# Patient Record
Sex: Female | Born: 1945 | Race: White | Hispanic: No | Marital: Married | Smoking: Former smoker
Health system: Southern US, Community
[De-identification: ages and names within clinical notes are randomized; demographics above are authoritative.]

## PROBLEM LIST (undated history)

## (undated) DIAGNOSIS — T4145XA Adverse effect of unspecified anesthetic, initial encounter: Secondary | ICD-10-CM

## (undated) DIAGNOSIS — G709 Myoneural disorder, unspecified: Secondary | ICD-10-CM

## (undated) DIAGNOSIS — M1990 Unspecified osteoarthritis, unspecified site: Secondary | ICD-10-CM

## (undated) DIAGNOSIS — T8859XA Other complications of anesthesia, initial encounter: Secondary | ICD-10-CM

## (undated) DIAGNOSIS — M8580 Other specified disorders of bone density and structure, unspecified site: Secondary | ICD-10-CM

## (undated) DIAGNOSIS — K219 Gastro-esophageal reflux disease without esophagitis: Secondary | ICD-10-CM

## (undated) DIAGNOSIS — I10 Essential (primary) hypertension: Secondary | ICD-10-CM

## (undated) DIAGNOSIS — N390 Urinary tract infection, site not specified: Secondary | ICD-10-CM

## (undated) DIAGNOSIS — E785 Hyperlipidemia, unspecified: Secondary | ICD-10-CM

## (undated) HISTORY — PX: BREAST SURGERY: SHX581

---

## 2015-06-21 NOTE — Patient Instructions (Addendum)
YOUR PROCEDURE IS SCHEDULED ON :  07/03/15  REPORT TO [facility] MAIN ENTRANCE FOLLOW SIGNS TO EAST ELEVATOR - GO TO 3rd FLOOR CHECK IN AT 3 EAST NURSES STATION (SHORT STAY) AT:  10:00 AM  CALL THIS NUMBER IF YOU HAVE PROBLEMS THE MORNING OF SURGERY [phone]  REMEMBER:ONLY 1 PER PERSON MAY GO TO SHORT STAY WITH YOU TO GET READY THE MORNING OF YOUR SURGERY  DO NOT EAT FOOD  AFTER MIDNIGHT  MAY HAVE CLEAR LIQUIDS UNTIL 7:00 AM  TAKE THESE MEDICINES THE MORNING OF SURGERY: GABAPENTIN / PREVACID  CLEAR LIQUID DIET  Foods Allowed                                                                     Foods Excluded  Coffee and tea, regular and decaf                             liquids that you cannot  Plain Jell-O in any flavor                                             see through such as: Fruit ices (not with fruit pulp)                                     milk, soups, orange juice  Iced Popsicles                                                          All solid food Carbonated beverages, regular and diet                                    Cranberry, grape and apple juices Sports drinks like Gatorade Lightly seasoned clear broth or consume(fat free) Sugar, honey syrup  _____________________________________________________________________    YOU MAY NOT HAVE ANY METAL ON YOUR BODY INCLUDING HAIR PINS AND PIERCING'S. DO NOT WEAR JEWELRY, MAKEUP, LOTIONS, POWDERS OR PERFUMES. DO NOT WEAR NAIL POLISH. DO NOT SHAVE 48 HRS PRIOR TO SURGERY. MEN MAY SHAVE FACE AND NECK.  DO NOT BRING VALUABLES TO HOSPITAL. [name] IS NOT RESPONSIBLE FOR VALUABLES.  CONTACTS, DENTURES OR PARTIALS MAY NOT BE WORN TO SURGERY. LEAVE SUITCASE IN CAR. CAN BE BROUGHT TO ROOM AFTER SURGERY.  PATIENTS DISCHARGED THE DAY OF SURGERY WILL NOT BE ALLOWED TO DRIVE HOME.  PLEASE READ OVER THE FOLLOWING INSTRUCTION  SHEETS _________________________________________________________________________________                                          [facility] - PREPARING FOR SURGERY  Before surgery, you can play  an important role.  Because skin is not sterile, your skin needs to be as free of germs as possible.  You can reduce the number of germs on your skin by washing with CHG (chlorahexidine gluconate) soap before surgery.  CHG is an antiseptic cleaner which kills germs and bonds with the skin to continue killing germs even after washing. Please DO NOT use if you have an allergy to CHG or antibacterial soaps.  If your skin becomes reddened/irritated stop using the CHG and inform your nurse when you arrive at Short Stay. Do not shave (including legs and underarms) for at least 48 hours prior to the first CHG shower.  You may shave your face. Please follow these instructions carefully:   1.  Shower with CHG Soap the night before surgery and the  morning of Surgery.   2.  If you choose to wash your hair, wash your hair first as usual with your  normal  Shampoo.   3.  After you shampoo, rinse your hair and body thoroughly to remove the  shampoo.                                         4.  Use CHG as you would any other liquid soap.  You can apply chg directly  to the skin and wash . Gently wash with scrungie or clean wascloth    5.  Apply the CHG Soap to your body ONLY FROM THE NECK DOWN.   Do not use on open                           Wound or open sores. Avoid contact with eyes, ears mouth and genitals (private parts).                        Genitals (private parts) with your normal soap.              6.  Wash thoroughly, paying special attention to the area where your surgery  will be performed.   7.  Thoroughly rinse your body with warm water from the neck down.   8.  DO NOT shower/wash with your normal soap after using and rinsing off  the CHG Soap .                9.  Pat yourself dry with a clean  towel.             10.  Wear clean night clothes to bed after shower             11.  Place clean sheets on your bed the night of your first shower and do not  sleep with pets.  Day of Surgery : Do not apply any lotions/deodorants the morning of surgery.  Please wear clean clothes to the hospital/surgery center.  FAILURE TO FOLLOW THESE INSTRUCTIONS MAY RESULT IN THE CANCELLATION OF YOUR SURGERY    PATIENT SIGNATURE_________________________________  ______________________________________________________________________     Amy Schmidt  An incentive spirometer is a tool that can help keep your lungs clear and active. This tool measures how well you are filling your lungs with each breath. Taking long deep breaths may help reverse or decrease the chance of developing breathing (pulmonary) problems (especially infection) following:  A long period of time when you  are unable to move or be active. BEFORE THE PROCEDURE   If the spirometer includes an indicator to show your best effort, your nurse or respiratory therapist will set it to a desired goal.  If possible, sit up straight or lean slightly forward. Try not to slouch.  Hold the incentive spirometer in an upright position. INSTRUCTIONS FOR USE   Sit on the edge of your bed if possible, or sit up as far as you can in bed or on a chair.  Hold the incentive spirometer in an upright position.  Breathe out normally.  Place the mouthpiece in your mouth and seal your lips tightly around it.  Breathe in slowly and as deeply as possible, raising the piston or the ball toward the top of the column.  Hold your breath for 3-5 seconds or for as long as possible. Allow the piston or ball to fall to the bottom of the column.  Remove the mouthpiece from your mouth and breathe out normally.  Rest for a few seconds and repeat Steps 1 through 7 at least 10 times every 1-2 hours when you are awake. Take your time and take a few  normal breaths between deep breaths.  The spirometer may include an indicator to show your best effort. Use the indicator as a goal to work toward during each repetition.  After each set of 10 deep breaths, practice coughing to be sure your lungs are clear. If you have an incision (the cut made at the time of surgery), support your incision when coughing by placing a pillow or rolled up towels firmly against it. Once you are able to get out of bed, walk around indoors and cough well. You may stop using the incentive spirometer when instructed by your caregiver.  RISKS AND COMPLICATIONS  Take your time so you do not get dizzy or light-headed.  If you are in pain, you may need to take or ask for pain medication before doing incentive spirometry. It is harder to take a deep breath if you are having pain. AFTER USE  Rest and breathe slowly and easily.  It can be helpful to keep track of a log of your progress. Your caregiver can provide you with a simple table to help with this. If you are using the spirometer at home, follow these instructions: Jessie IF:   You are having difficultly using the spirometer.  You have trouble using the spirometer as often as instructed.  Your pain medication is not giving enough relief while using the spirometer.  You develop fever of 100.5 F (38.1 C) or higher. SEEK IMMEDIATE MEDICAL CARE IF:   You cough up bloody sputum that had not been present before.  You develop fever of 102 F (38.9 C) or greater.  You develop worsening pain at or near the incision site. MAKE SURE YOU:   Understand these instructions.  Will watch your condition.  Will get help right away if you are not doing well or get worse. Document Released: 06/30/2006 Document Revised: 05/12/2011 Document Reviewed: 08/31/2006 [employer] Patient Information 2014 [employer], [place].   ________________________________________________________________________  WHAT IS A BLOOD  TRANSFUSION? Blood Transfusion Information  A transfusion is the replacement of blood or some of its parts. Blood is made up of multiple cells which provide different functions.  Red blood cells carry oxygen and are used for blood loss replacement.  White blood cells fight against infection.  Platelets control bleeding.  Plasma helps clot blood.  Other blood  products are available for specialized needs, such as hemophilia or other clotting disorders. BEFORE THE TRANSFUSION  Who gives blood for transfusions?   Healthy volunteers who are fully evaluated to make sure their blood is safe. This is blood bank blood. Transfusion therapy is the safest it has ever been in the practice of medicine. Before blood is taken from a donor, a complete history is taken to make sure that person has no history of diseases nor engages in risky social behavior (examples are intravenous drug use or sexual activity with multiple partners). The donor's travel history is screened to minimize risk of transmitting infections, such as malaria. The donated blood is tested for signs of infectious diseases, such as HIV and hepatitis. The blood is then tested to be sure it is compatible with you in order to minimize the chance of a transfusion reaction. If you or a relative donates blood, this is often done in anticipation of surgery and is not appropriate for emergency situations. It takes many days to process the donated blood. RISKS AND COMPLICATIONS Although transfusion therapy is very safe and saves many lives, the main dangers of transfusion include:   Getting an infectious disease.  Developing a transfusion reaction. This is an allergic reaction to something in the blood you were given. Every precaution is taken to prevent this. The decision to have a blood transfusion has been considered carefully by your caregiver before blood is given. Blood is not given unless the benefits outweigh the risks. AFTER THE  TRANSFUSION  Right after receiving a blood transfusion, you will usually feel much better and more energetic. This is especially true if your red blood cells have gotten low (anemic). The transfusion raises the level of the red blood cells which carry oxygen, and this usually causes an energy increase.  The nurse administering the transfusion will monitor you carefully for complications. HOME CARE INSTRUCTIONS  No special instructions are needed after a transfusion. You may find your energy is better. Speak with your caregiver about any limitations on activity for underlying diseases you may have. SEEK MEDICAL CARE IF:   Your condition is not improving after your transfusion.  You develop redness or irritation at the intravenous (IV) site. SEEK IMMEDIATE MEDICAL CARE IF:  Any of the following symptoms occur over the next 12 hours:  Shaking chills.  You have a temperature by mouth above 102 F (38.9 C), not controlled by medicine.  Chest, back, or muscle pain.  People around you feel you are not acting correctly or are confused.  Shortness of breath or difficulty breathing.  Dizziness and fainting.  You get a rash or develop hives.  You have a decrease in urine output.  Your urine turns a dark color or changes to pink, red, or brown. Any of the following symptoms occur over the next 10 days:  You have a temperature by mouth above 102 F (38.9 C), not controlled by medicine.  Shortness of breath.  Weakness after normal activity.  The white part of the eye turns yellow (jaundice).  You have a decrease in the amount of urine or are urinating less often.  Your urine turns a dark color or changes to pink, red, or brown. Document Released: 02/15/2000 Document Revised: 05/12/2011 Document Reviewed: 10/04/2007 [facility] Patient Information 2014 [place], [place].  _______________________________________________________________________

## 2015-06-21 NOTE — H&P (Signed)
TOTAL HIP ADMISSION H&P  Patient is admitted for left total hip arthroplasty, anterior approach.  Subjective:  Chief Complaint:    Left hip primary OA / pain  HPI: Amy Schmidt, 70 y.o. female, has a history of pain and functional disability in the left hip(s) due to arthritis and patient has failed non-surgical conservative treatments for greater than 12 weeks to include NSAID's and/or analgesics, corticosteriod injections and activity modification.  Onset of symptoms was gradual starting 5+ years ago with gradually worsening course since that time.The patient noted no past surgery on the right hip(s).  Patient currently rates pain in the right hip at 7 out of 10 with activity. Patient has night pain, worsening of pain with activity and weight bearing, trendelenberg gait, pain that interfers with activities of daily living and pain with passive range of motion. Patient has evidence of periarticular osteophytes and joint space narrowing by imaging studies. This condition presents safety issues increasing the risk of falls.  There is no current active infection.   Risks, benefits and expectations were discussed with the patient.  Risks including but not limited to the risk of anesthesia, blood clots, nerve damage, blood vessel damage, failure of the prosthesis, infection and up to and including death.  Patient understand the risks, benefits and expectations and wishes to proceed with surgery.   PCP: No primary care provider on file.  D/C Plans:      Home   Post-op Meds:       No Rx given  Tranexamic Acid:      To be given - IV   Decadron:      Is to be given  FYI:     ASA  Norco    There are no active problems to display for this patient.  No past medical history on file.  No past surgical history on file.  No prescriptions prior to admission   Allergies not on file   Social History  Substance Use Topics  . Smoking status: Not on file  . Smokeless tobacco: Not on file  . Alcohol  Use: Not on file       Review of Systems  Constitutional: Negative.   HENT: Positive for tinnitus.   Eyes: Negative.   Respiratory: Negative.   Cardiovascular: Negative.   Gastrointestinal: Positive for heartburn.  Genitourinary: Negative.   Musculoskeletal: Positive for joint pain.  Skin: Negative.   Neurological: Negative.   Endo/Heme/Allergies: Negative.   Psychiatric/Behavioral: Negative.     Objective:  Physical Exam  Constitutional: She is oriented to person, place, and time. She appears well-developed.  HENT:  Head: Normocephalic.  Eyes: Pupils are equal, round, and reactive to light.  Neck: Neck supple. No JVD present. No tracheal deviation present. No thyromegaly present.  Cardiovascular: Normal rate, regular rhythm, normal heart sounds and intact distal pulses.   Respiratory: Effort normal and breath sounds normal. No stridor. No respiratory distress. She has no wheezes.  GI: Soft. There is no tenderness. There is no guarding.  Musculoskeletal:       Left hip: She exhibits decreased range of motion, decreased strength, tenderness and bony tenderness. She exhibits no swelling, no deformity and no laceration.  Lymphadenopathy:    She has no cervical adenopathy.  Neurological: She is alert and oriented to person, place, and time.  Skin: Skin is warm and dry.  Psychiatric: She has a normal mood and affect.      Imaging Review Plain radiographs demonstrate severe degenerative joint disease of the  left hip(s). The bone quality appears to be good for age and reported activity level.  Assessment/Plan:  End stage arthritis, left hip(s)  The patient history, physical examination, clinical judgement of the provider and imaging studies are consistent with end stage degenerative joint disease of the left hip(s) and total hip arthroplasty is deemed medically necessary. The treatment options including medical management, injection therapy, arthroscopy and arthroplasty were  discussed at length. The risks and benefits of total hip arthroplasty were presented and reviewed. The risks due to aseptic loosening, infection, stiffness, dislocation/subluxation,  thromboembolic complications and other imponderables were discussed.  The patient acknowledged the explanation, agreed to proceed with the plan and consent was signed. Patient is being admitted for inpatient treatment for surgery, pain control, PT, OT, prophylactic antibiotics, VTE prophylaxis, progressive ambulation and ADL's and discharge planning.The patient is planning to be discharged home with home health services.      Anastasio AuerbachMatthew S. Claryssa Sandner   PA-C  06/21/2015, 12:39 PM

## 2015-06-22 ENCOUNTER — Encounter (HOSPITAL_COMMUNITY): Payer: Self-pay

## 2015-06-22 ENCOUNTER — Encounter (HOSPITAL_COMMUNITY)
Admission: RE | Admit: 2015-06-22 | Discharge: 2015-06-22 | Disposition: A | Discharge status: Home / Self Care | Payer: Medicare Other | Source: Ambulatory Visit | Attending: Orthopedic Surgery | Admitting: Orthopedic Surgery

## 2015-06-22 DIAGNOSIS — Z01812 Encounter for preprocedural laboratory examination: Secondary | ICD-10-CM | POA: Diagnosis not present

## 2015-06-22 DIAGNOSIS — M1612 Unilateral primary osteoarthritis, left hip: Secondary | ICD-10-CM | POA: Diagnosis not present

## 2015-06-22 DIAGNOSIS — Z01818 Encounter for other preprocedural examination: Secondary | ICD-10-CM | POA: Insufficient documentation

## 2015-06-22 DIAGNOSIS — Z0183 Encounter for blood typing: Secondary | ICD-10-CM | POA: Diagnosis not present

## 2015-06-22 DIAGNOSIS — I10 Essential (primary) hypertension: Secondary | ICD-10-CM | POA: Insufficient documentation

## 2015-06-22 HISTORY — DX: Adverse effect of unspecified anesthetic, initial encounter: T41.45XA

## 2015-06-22 HISTORY — DX: Gastro-esophageal reflux disease without esophagitis: K21.9

## 2015-06-22 HISTORY — DX: Hyperlipidemia, unspecified: E78.5

## 2015-06-22 HISTORY — DX: Urinary tract infection, site not specified: N39.0

## 2015-06-22 HISTORY — DX: Other specified disorders of bone density and structure, unspecified site: M85.80

## 2015-06-22 HISTORY — DX: Unspecified osteoarthritis, unspecified site: M19.90

## 2015-06-22 HISTORY — DX: Essential (primary) hypertension: I10

## 2015-06-22 HISTORY — DX: Other complications of anesthesia, initial encounter: T88.59XA

## 2015-06-22 LAB — TYPE AND SCREEN
ABO/RH(D): O POS
ANTIBODY SCREEN: NEGATIVE

## 2015-06-22 LAB — CBC
HCT: 41.9 % (ref 36.0–46.0)
Hemoglobin: 14.1 g/dL (ref 12.0–15.0)
MCH: 29.7 pg (ref 26.0–34.0)
MCHC: 33.7 g/dL (ref 30.0–36.0)
MCV: 88.4 fL (ref 78.0–100.0)
PLATELETS: 344 10*3/uL (ref 150–400)
RBC: 4.74 MIL/uL (ref 3.87–5.11)
RDW: 12.9 % (ref 11.5–15.5)
WBC: 8.7 10*3/uL (ref 4.0–10.5)

## 2015-06-22 LAB — BASIC METABOLIC PANEL
Anion gap: 10 (ref 5–15)
BUN: 20 mg/dL (ref 6–20)
CHLORIDE: 100 mmol/L — AB (ref 101–111)
CO2: 27 mmol/L (ref 22–32)
CREATININE: 0.82 mg/dL (ref 0.44–1.00)
Calcium: 9.9 mg/dL (ref 8.9–10.3)
GFR calc non Af Amer: >60 mL/min (ref >60)
Glucose, Bld: 78 mg/dL (ref 65–99)
Potassium: 4.2 mmol/L (ref 3.5–5.1)
Sodium: 137 mmol/L (ref 135–145)

## 2015-06-22 LAB — SURGICAL PCR SCREEN
MRSA, PCR: NEGATIVE
STAPHYLOCOCCUS AUREUS: NEGATIVE

## 2015-06-22 LAB — PROTIME-INR
INR: 1.02 (ref 0.00–1.49)
Prothrombin Time: 13.2 seconds (ref 11.6–15.2)

## 2015-06-22 LAB — ABO/RH: ABO/RH(D): O POS

## 2015-07-03 ENCOUNTER — Inpatient Hospital Stay (HOSPITAL_COMMUNITY)
Admission: RE | Admit: 2015-07-03 | Discharge: 2015-07-04 | DRG: 470 | Disposition: A | Discharge status: Home Health | Payer: Medicare Other | Source: Ambulatory Visit | Attending: Orthopedic Surgery | Admitting: Orthopedic Surgery

## 2015-07-03 ENCOUNTER — Encounter (HOSPITAL_COMMUNITY)
Admission: RE | Disposition: A | Discharge status: Home Health | Payer: Self-pay | Source: Ambulatory Visit | Attending: Orthopedic Surgery

## 2015-07-03 ENCOUNTER — Encounter (HOSPITAL_COMMUNITY): Payer: Self-pay

## 2015-07-03 ENCOUNTER — Inpatient Hospital Stay (HOSPITAL_COMMUNITY): Payer: Medicare Other | Admitting: Anesthesiology

## 2015-07-03 ENCOUNTER — Inpatient Hospital Stay (HOSPITAL_COMMUNITY): Payer: Medicare Other

## 2015-07-03 DIAGNOSIS — I10 Essential (primary) hypertension: Secondary | ICD-10-CM | POA: Diagnosis present

## 2015-07-03 DIAGNOSIS — Z96649 Presence of unspecified artificial hip joint: Secondary | ICD-10-CM

## 2015-07-03 DIAGNOSIS — H9319 Tinnitus, unspecified ear: Secondary | ICD-10-CM | POA: Diagnosis present

## 2015-07-03 DIAGNOSIS — E663 Overweight: Secondary | ICD-10-CM | POA: Diagnosis present

## 2015-07-03 DIAGNOSIS — Z6828 Body mass index (BMI) 28.0-28.9, adult: Secondary | ICD-10-CM

## 2015-07-03 DIAGNOSIS — Z79899 Other long term (current) drug therapy: Secondary | ICD-10-CM

## 2015-07-03 DIAGNOSIS — M1612 Unilateral primary osteoarthritis, left hip: Principal | ICD-10-CM | POA: Diagnosis present

## 2015-07-03 DIAGNOSIS — M25552 Pain in left hip: Secondary | ICD-10-CM | POA: Diagnosis present

## 2015-07-03 HISTORY — PX: TOTAL HIP ARTHROPLASTY: SHX124

## 2015-07-03 SURGERY — ARTHROPLASTY, HIP, TOTAL, ANTERIOR APPROACH
Anesthesia: Spinal | Site: Hip | Laterality: Left

## 2015-07-03 MED ORDER — CEFAZOLIN SODIUM-DEXTROSE 2-4 GM/100ML-% IV SOLN
2.0000 g | Freq: Four times a day (QID) | INTRAVENOUS | Status: AC
  Administered 2015-07-03 – 2015-07-04 (×2): 2 g via INTRAVENOUS
  Filled 2015-07-03 (×2): qty 100

## 2015-07-03 MED ORDER — SODIUM CHLORIDE 0.9 % IV SOLN
1000.0000 mg | Freq: Once | INTRAVENOUS | Status: AC
  Administered 2015-07-03: 1000 mg via INTRAVENOUS
  Filled 2015-07-03: qty 10

## 2015-07-03 MED ORDER — ALUM & MAG HYDROXIDE-SIMETH 200-200-20 MG/5ML PO SUSP: 30.0000 mL | ORAL | Status: DC | PRN

## 2015-07-03 MED ORDER — LACTATED RINGERS IV SOLN: INTRAVENOUS | Status: DC

## 2015-07-03 MED ORDER — ASPIRIN EC 325 MG PO TBEC
325.0000 mg | DELAYED_RELEASE_TABLET | Freq: Two times a day (BID) | ORAL | Status: DC
  Administered 2015-07-04: 325 mg via ORAL
  Filled 2015-07-03 (×3): qty 1

## 2015-07-03 MED ORDER — MIDAZOLAM HCL 2 MG/2ML IJ SOLN
INTRAMUSCULAR | Status: DC | PRN
  Administered 2015-07-03: 2 mg via INTRAVENOUS

## 2015-07-03 MED ORDER — CELECOXIB 200 MG PO CAPS
200.0000 mg | ORAL_CAPSULE | Freq: Two times a day (BID) | ORAL | Status: DC
  Administered 2015-07-03 – 2015-07-04 (×2): 200 mg via ORAL
  Filled 2015-07-03 (×3): qty 1

## 2015-07-03 MED ORDER — PANTOPRAZOLE SODIUM 40 MG PO TBEC
40.0000 mg | DELAYED_RELEASE_TABLET | Freq: Every day | ORAL | Status: DC
  Administered 2015-07-04: 40 mg via ORAL
  Filled 2015-07-03: qty 1

## 2015-07-03 MED ORDER — BISACODYL 10 MG RE SUPP: 10.0000 mg | Freq: Every day | RECTAL | Status: DC | PRN

## 2015-07-03 MED ORDER — LACTATED RINGERS IV SOLN
INTRAVENOUS | Status: DC
  Administered 2015-07-03 (×2): via INTRAVENOUS

## 2015-07-03 MED ORDER — MENTHOL 3 MG MT LOZG: 1.0000 | LOZENGE | OROMUCOSAL | Status: DC | PRN

## 2015-07-03 MED ORDER — PROPOFOL 10 MG/ML IV BOLUS
INTRAVENOUS | Status: AC
  Filled 2015-07-03: qty 60

## 2015-07-03 MED ORDER — PHENYLEPHRINE 40 MCG/ML (10ML) SYRINGE FOR IV PUSH (FOR BLOOD PRESSURE SUPPORT)
PREFILLED_SYRINGE | INTRAVENOUS | Status: AC
  Filled 2015-07-03: qty 10

## 2015-07-03 MED ORDER — FERROUS SULFATE 325 (65 FE) MG PO TABS
325.0000 mg | ORAL_TABLET | Freq: Three times a day (TID) | ORAL | Status: DC
  Administered 2015-07-04: 325 mg via ORAL
  Filled 2015-07-03 (×4): qty 1

## 2015-07-03 MED ORDER — ONDANSETRON HCL 4 MG/2ML IJ SOLN: 4.0000 mg | Freq: Four times a day (QID) | INTRAMUSCULAR | Status: DC | PRN

## 2015-07-03 MED ORDER — CHLORHEXIDINE GLUCONATE 4 % EX LIQD: 60.0000 mL | Freq: Once | CUTANEOUS | Status: DC

## 2015-07-03 MED ORDER — METHOCARBAMOL 500 MG PO TABS
500.0000 mg | ORAL_TABLET | Freq: Four times a day (QID) | ORAL | Status: DC | PRN
  Administered 2015-07-04 (×2): 500 mg via ORAL
  Filled 2015-07-03 (×2): qty 1

## 2015-07-03 MED ORDER — DEXAMETHASONE SODIUM PHOSPHATE 10 MG/ML IJ SOLN
10.0000 mg | Freq: Once | INTRAMUSCULAR | Status: DC
  Filled 2015-07-03: qty 1

## 2015-07-03 MED ORDER — HYDROCODONE-ACETAMINOPHEN 7.5-325 MG PO TABS
1.0000 | ORAL_TABLET | ORAL | Status: DC
  Administered 2015-07-03: 1 via ORAL
  Administered 2015-07-03: 2 via ORAL
  Administered 2015-07-03: 1 via ORAL
  Administered 2015-07-04 (×3): 2 via ORAL
  Filled 2015-07-03 (×2): qty 1
  Filled 2015-07-03 (×4): qty 2

## 2015-07-03 MED ORDER — ONDANSETRON HCL 4 MG PO TABS: 4.0000 mg | ORAL_TABLET | Freq: Four times a day (QID) | ORAL | Status: DC | PRN

## 2015-07-03 MED ORDER — ATORVASTATIN CALCIUM 10 MG PO TABS
5.0000 mg | ORAL_TABLET | Freq: Every day | ORAL | Status: DC
  Administered 2015-07-03: 5 mg via ORAL
  Filled 2015-07-03 (×2): qty 0.5

## 2015-07-03 MED ORDER — AMLODIPINE BESYLATE 5 MG PO TABS
5.0000 mg | ORAL_TABLET | Freq: Every day | ORAL | Status: DC
  Administered 2015-07-03: 5 mg via ORAL
  Filled 2015-07-03 (×2): qty 1

## 2015-07-03 MED ORDER — ONDANSETRON HCL 4 MG/2ML IJ SOLN
INTRAMUSCULAR | Status: DC | PRN
Start: 2015-07-03 — End: 2015-07-03
  Administered 2015-07-03 (×2): 2 mg via INTRAVENOUS

## 2015-07-03 MED ORDER — LIDOCAINE HCL 1 % IJ SOLN
INTRAMUSCULAR | Status: AC
  Filled 2015-07-03: qty 20

## 2015-07-03 MED ORDER — METOCLOPRAMIDE HCL 5 MG/ML IJ SOLN: 5.0000 mg | Freq: Three times a day (TID) | INTRAMUSCULAR | Status: DC | PRN

## 2015-07-03 MED ORDER — DOCUSATE SODIUM 100 MG PO CAPS
100.0000 mg | ORAL_CAPSULE | Freq: Two times a day (BID) | ORAL | Status: DC
  Administered 2015-07-03 – 2015-07-04 (×2): 100 mg via ORAL

## 2015-07-03 MED ORDER — HYDROMORPHONE HCL 1 MG/ML IJ SOLN: 0.5000 mg | INTRAMUSCULAR | Status: DC | PRN

## 2015-07-03 MED ORDER — PHENOL 1.4 % MT LIQD: 1.0000 | OROMUCOSAL | Status: DC | PRN

## 2015-07-03 MED ORDER — FLUOCINONIDE 0.05 % EX OINT
1.0000 "application " | TOPICAL_OINTMENT | Freq: Two times a day (BID) | CUTANEOUS | Status: DC
  Filled 2015-07-03: qty 15

## 2015-07-03 MED ORDER — DEXAMETHASONE SODIUM PHOSPHATE 10 MG/ML IJ SOLN
10.0000 mg | Freq: Once | INTRAMUSCULAR | Status: AC
  Administered 2015-07-03: 10 mg via INTRAVENOUS

## 2015-07-03 MED ORDER — BUPIVACAINE HCL (PF) 0.75 % IJ SOLN
INTRAMUSCULAR | Status: DC | PRN
  Administered 2015-07-03: 15 mg via INTRATHECAL

## 2015-07-03 MED ORDER — MAGNESIUM CITRATE PO SOLN: 1.0000 | Freq: Once | ORAL | Status: DC | PRN

## 2015-07-03 MED ORDER — FENTANYL CITRATE (PF) 100 MCG/2ML IJ SOLN
INTRAMUSCULAR | Status: AC
  Filled 2015-07-03: qty 2

## 2015-07-03 MED ORDER — POLYETHYLENE GLYCOL 3350 17 G PO PACK
17.0000 g | PACK | Freq: Two times a day (BID) | ORAL | Status: DC
  Administered 2015-07-03 – 2015-07-04 (×2): 17 g via ORAL

## 2015-07-03 MED ORDER — METOCLOPRAMIDE HCL 5 MG PO TABS
5.0000 mg | ORAL_TABLET | Freq: Three times a day (TID) | ORAL | Status: DC | PRN
  Filled 2015-07-03: qty 2

## 2015-07-03 MED ORDER — HYDROMORPHONE HCL 1 MG/ML IJ SOLN: 0.2500 mg | INTRAMUSCULAR | Status: DC | PRN

## 2015-07-03 MED ORDER — CEFAZOLIN SODIUM-DEXTROSE 2-4 GM/100ML-% IV SOLN
INTRAVENOUS | Status: AC
  Filled 2015-07-03: qty 100

## 2015-07-03 MED ORDER — MIDAZOLAM HCL 2 MG/2ML IJ SOLN
INTRAMUSCULAR | Status: AC
  Filled 2015-07-03: qty 2

## 2015-07-03 MED ORDER — GABAPENTIN 300 MG PO CAPS
300.0000 mg | ORAL_CAPSULE | Freq: Two times a day (BID) | ORAL | Status: DC | PRN
  Filled 2015-07-03: qty 1

## 2015-07-03 MED ORDER — POTASSIUM CHLORIDE 2 MEQ/ML IV SOLN
100.0000 mL/h | INTRAVENOUS | Status: DC
  Administered 2015-07-03 – 2015-07-04 (×2): 100 mL/h via INTRAVENOUS
  Filled 2015-07-03 (×4): qty 1000

## 2015-07-03 MED ORDER — TRANEXAMIC ACID 1000 MG/10ML IV SOLN
1000.0000 mg | Freq: Once | INTRAVENOUS | Status: AC
  Administered 2015-07-03: 1000 mg via INTRAVENOUS
  Filled 2015-07-03: qty 10

## 2015-07-03 MED ORDER — PHENYLEPHRINE HCL 10 MG/ML IJ SOLN
INTRAMUSCULAR | Status: DC | PRN
  Administered 2015-07-03 (×4): 160 ug via INTRAVENOUS
  Administered 2015-07-03: 80 ug via INTRAVENOUS

## 2015-07-03 MED ORDER — PROPOFOL 500 MG/50ML IV EMUL
INTRAVENOUS | Status: DC | PRN
  Administered 2015-07-03: 50 ug/kg/min via INTRAVENOUS

## 2015-07-03 MED ORDER — DEXTROSE 5 % IV SOLN
500.0000 mg | Freq: Four times a day (QID) | INTRAVENOUS | Status: DC | PRN
  Administered 2015-07-03: 500 mg via INTRAVENOUS
  Filled 2015-07-03: qty 5
  Filled 2015-07-03: qty 550

## 2015-07-03 MED ORDER — DIPHENHYDRAMINE HCL 25 MG PO CAPS: 25.0000 mg | ORAL_CAPSULE | Freq: Four times a day (QID) | ORAL | Status: DC | PRN

## 2015-07-03 MED ORDER — CEFAZOLIN SODIUM-DEXTROSE 2-4 GM/100ML-% IV SOLN
2.0000 g | INTRAVENOUS | Status: AC
  Administered 2015-07-03: 2 g via INTRAVENOUS
  Filled 2015-07-03: qty 100

## 2015-07-03 SURGICAL SUPPLY — 37 items
BAG DECANTER FOR FLEXI CONT (MISCELLANEOUS) IMPLANT
BAG ZIPLOCK 12X15 (MISCELLANEOUS) IMPLANT
CAPT HIP TOTAL 2 ×3 IMPLANT
CLOTH BEACON ORANGE TIMEOUT ST (SAFETY) ×3 IMPLANT
COVER PERINEAL POST (MISCELLANEOUS) ×3 IMPLANT
DRAPE STERI IOBAN 125X83 (DRAPES) ×3 IMPLANT
DRAPE U-SHAPE 47X51 STRL (DRAPES) ×6 IMPLANT
DRESSING AQUACEL AG SP 3.5X10 (GAUZE/BANDAGES/DRESSINGS) ×1 IMPLANT
DRSG AQUACEL AG ADV 3.5X10 (GAUZE/BANDAGES/DRESSINGS) ×3 IMPLANT
DRSG AQUACEL AG SP 3.5X10 (GAUZE/BANDAGES/DRESSINGS) ×3
DURAPREP 26ML APPLICATOR (WOUND CARE) ×3 IMPLANT
ELECT REM PT RETURN 15FT ADLT (MISCELLANEOUS) IMPLANT
ELECT REM PT RETURN 9FT ADLT (ELECTROSURGICAL) ×3
ELECTRODE REM PT RTRN 9FT ADLT (ELECTROSURGICAL) ×1 IMPLANT
GLOVE BIOGEL M 7.0 STRL (GLOVE) IMPLANT
GLOVE BIOGEL PI IND STRL 7.5 (GLOVE) ×1 IMPLANT
GLOVE BIOGEL PI IND STRL 8.5 (GLOVE) ×1 IMPLANT
GLOVE BIOGEL PI INDICATOR 7.5 (GLOVE) ×2
GLOVE BIOGEL PI INDICATOR 8.5 (GLOVE) ×2
GLOVE ECLIPSE 8.0 STRL XLNG CF (GLOVE) ×6 IMPLANT
GLOVE ORTHO TXT STRL SZ7.5 (GLOVE) ×3 IMPLANT
GOWN STRL REUS W/TWL LRG LVL3 (GOWN DISPOSABLE) ×3 IMPLANT
GOWN STRL REUS W/TWL XL LVL3 (GOWN DISPOSABLE) ×3 IMPLANT
HOLDER FOLEY CATH W/STRAP (MISCELLANEOUS) ×3 IMPLANT
LIQUID BAND (GAUZE/BANDAGES/DRESSINGS) ×3 IMPLANT
PACK ANTERIOR HIP CUSTOM (KITS) ×3 IMPLANT
SAW OSC TIP CART 19.5X105X1.3 (SAW) ×3 IMPLANT
SCREW 6.5MMX30MM (Screw) ×3 IMPLANT
SUT MNCRL AB 4-0 PS2 18 (SUTURE) ×3 IMPLANT
SUT VIC AB 1 CT1 36 (SUTURE) ×9 IMPLANT
SUT VIC AB 2-0 CT1 27 (SUTURE) ×4
SUT VIC AB 2-0 CT1 TAPERPNT 27 (SUTURE) ×2 IMPLANT
SUT VLOC 180 0 24IN GS25 (SUTURE) ×3 IMPLANT
TRAY FOLEY W/METER SILVER 14FR (SET/KITS/TRAYS/PACK) ×3 IMPLANT
TRAY FOLEY W/METER SILVER 16FR (SET/KITS/TRAYS/PACK) IMPLANT
WATER STERILE IRR 1500ML POUR (IV SOLUTION) ×3 IMPLANT
YANKAUER SUCT BULB TIP 10FT TU (MISCELLANEOUS) IMPLANT

## 2015-07-03 NOTE — Interval H&P Note (Signed)
History and Physical Interval Note:  07/03/2015 11:14 AM  Amy Schmidt  has presented today for surgery, with the diagnosis of left hip osteoarthritis  The various methods of treatment have been discussed with the patient and family. After consideration of risks, benefits and other options for treatment, the patient has consented to  Procedure(s): LEFT TOTAL HIP ARTHROPLASTY ANTERIOR APPROACH (Left) as a surgical intervention .  The patient's history has been reviewed, patient examined, no change in status, stable for surgery.  I have reviewed the patient's chart and labs.  Questions were answered to the patient's satisfaction.     Shelda PalLIN,Laporchia Nakajima D

## 2015-07-03 NOTE — Transfer of Care (Signed)
Immediate Anesthesia Transfer of Care Note  Patient: Amy Schmidt  Procedure(s) Performed: Procedure(s): LEFT TOTAL HIP ARTHROPLASTY ANTERIOR APPROACH (Left)  Patient Location: PACU  Anesthesia Type:General  Level of Consciousness: alert  and oriented  Airway & Oxygen Therapy: Patient connected to face mask oxygen  Post-op Assessment: Post -op Vital signs reviewed and stable  Post vital signs: stable  Last Vitals:  Filed Vitals:   07/03/15 0935  BP: 157/87  Pulse: 82  Temp: 36.7 C  Resp: 18    Last Pain: There were no vitals filed for this visit.    Patients Stated Pain Goal: 4 (07/03/15 1148)  Complications: No apparent anesthesia complications

## 2015-07-03 NOTE — Anesthesia Procedure Notes (Addendum)
Date/Time: 07/03/2015 1:15 PM Performed by: Pilar Grammes Pre-anesthesia Checklist: Patient identified, Emergency Drugs available, Patient being monitored, Suction available and Timeout performed Patient Re-evaluated:Patient Re-evaluated prior to inductionOxygen Delivery Method: Simple face mask Preoxygenation: SAB w adequate spontaneous ventilation.    Spinal Patient location during procedure: OR Start time: 07/03/2015 12:32 PM End time: 07/03/2015 12:37 PM Staffing Anesthesiologist: Rod Mae Performed by: anesthesiologist  Preanesthetic Checklist Completed: patient identified, site marked, surgical consent, pre-op evaluation, timeout performed, IV checked, risks and benefits discussed and monitors and equipment checked Spinal Block Patient position: sitting Prep: Betadine Patient monitoring: heart rate, continuous pulse ox and blood pressure Approach: midline Location: L3-4 Injection technique: single-shot Needle Needle type: [place]acre  Needle gauge: 25 G Needle length: 9 cm Assessment Sensory level: T6 Additional Notes Expiration date of kit checked and confirmed. Patient tolerated procedure well, without complications.

## 2015-07-03 NOTE — Anesthesia Postprocedure Evaluation (Signed)
Anesthesia Post Note  Patient: Amy Schmidt  Procedure(s) Performed: Procedure(s) (LRB): LEFT TOTAL HIP ARTHROPLASTY ANTERIOR APPROACH (Left)  Patient location during evaluation: PACU Anesthesia Type: Spinal Level of consciousness: awake and alert Pain management: pain level controlled Vital Signs Assessment: post-procedure vital signs reviewed and stable Respiratory status: spontaneous breathing, nonlabored ventilation, respiratory function stable and patient connected to nasal cannula oxygen Cardiovascular status: blood pressure returned to baseline and stable Postop Assessment: no signs of nausea or vomiting Anesthetic complications: no    Last Vitals:  Filed Vitals:   07/03/15 1547 07/03/15 1650  BP: 148/72 138/77  Pulse: 76 88  Temp: 36.4 C 36.5 C  Resp: 14 16    Last Pain:  Filed Vitals:   07/03/15 1659  PainSc: 3                  Binnie Vonderhaar L

## 2015-07-03 NOTE — Op Note (Signed)
NAME:  Amy Schmidt                ACCOUNT NO.: [id number]      MEDICAL RECORD NO.: [id number]      FACILITY:  [facility]      PHYSICIAN:  Durene Romans D  DATE OF BIRTH:  1946-01-06     DATE OF PROCEDURE:  07/03/2015                                 OPERATIVE REPORT         PREOPERATIVE DIAGNOSIS: Left  hip osteoarthritis.      POSTOPERATIVE DIAGNOSIS:  Left hip osteoarthritis.      PROCEDURE:  Left total hip replacement through an anterior approach   utilizing DePuy THR system, component size 50mm pinnacle cup, a size 32+4 neutral   Altrex liner, a size 2 Hi Tri Lock stem with a 32+1 delta ceramic   ball.      SURGEON:  Madlyn Frankel. Charlann Boxer, M.D.      ASSISTANT:  Lanney Gins, PA-C      ANESTHESIA:  Spinal.      SPECIMENS:  None.      COMPLICATIONS:  None.      BLOOD LOSS:  400 cc     DRAINS:  None      INDICATION OF THE PROCEDURE:  Amy Schmidt is a 70 y.o. female who had   presented to office for evaluation of left hip pain.  Radiographs revealed   progressive degenerative changes with bone-on-bone   articulation to the  hip joint.  The patient had painful limited range of   motion significantly affecting their overall quality of life.  The patient was failing to    respond to conservative measures, and at this point was ready   to proceed with more definitive measures.  The patient has noted progressive   degenerative changes in his hip, progressive problems and dysfunction   with regarding the hip prior to surgery.  Consent was obtained for   benefit of pain relief.  Specific risk of infection, DVT, component   failure, dislocation, need for revision surgery, as well discussion of   the anterior versus posterior approach were reviewed.  Consent was   obtained for benefit of anterior pain relief through an anterior   approach.      PROCEDURE IN DETAIL:  The patient was brought to operative theater.   Once adequate anesthesia, preoperative  antibiotics, 2gm of Ancef, 1 gm of Tranexamic Acid, and 10 mg of Decadron administered.   The patient was positioned supine on the OSI Hanna table.  Once adequate   padding of boney process was carried out, we had predraped out the hip, and  used fluoroscopy to confirm orientation of the pelvis and position.      The left hip was then prepped and draped from proximal iliac crest to   mid thigh with shower curtain technique.      Time-out was performed identifying the patient, planned procedure, and   extremity.     An incision was then made 2 cm distal and lateral to the   anterior superior iliac spine extending over the orientation of the   tensor fascia lata muscle and sharp dissection was carried down to the   fascia of the muscle and protractor placed in the soft tissues.      The fascia was then  incised.  The muscle belly was identified and swept   laterally and retractor placed along the superior neck.  Following   cauterization of the circumflex vessels and removing some pericapsular   fat, a second cobra retractor was placed on the inferior neck.  A third   retractor was placed on the anterior acetabulum after elevating the   anterior rectus.  A L-capsulotomy was along the line of the   superior neck to the trochanteric fossa, then extended proximally and   distally.  Tag sutures were placed and the retractors were then placed   intracapsular.  We then identified the trochanteric fossa and   orientation of my neck cut, confirmed this radiographically   and then made a neck osteotomy with the femur on traction.  The femoral   head was removed without difficulty or complication.  Traction was let   off and retractors were placed posterior and anterior around the   acetabulum.      The labrum and foveal tissue were debrided.  I began reaming with a 45mm   reamer and reamed up to 49mm reamer with good bony bed preparation and a 50mm   cup was chosen.  The final 50mm Pinnacle cup  was then impacted under fluoroscopy  to confirm the depth of penetration and orientation with respect to   abduction.  A screw was placed followed by the hole eliminator.  The final   32+4 neutral Altrex liner was impacted with good visualized rim fit.  The cup was positioned anatomically within the acetabular portion of the pelvis.      At this point, the femur was rolled at 80 degrees.  Further capsule was   released off the inferior aspect of the femoral neck.  I then   released the superior capsule proximally.  The hook was placed laterally   along the femur and elevated manually and held in position with the bed   hook.  The leg was then extended and adducted with the leg rolled to 100   degrees of external rotation.  Once the proximal femur was fully   exposed, I used a box osteotome to set orientation.  I then began   broaching with the starting chili pepper broach and passed this by hand and then broached up to 2.  With the 2 broach in place I chose a high offset neck and did trial reductions.  The offset was appropriate, leg lengths   appeared to be equal best matched with a +1 head ball confirmed radiographically.   Given these findings, I went ahead and dislocated the hip, repositioned all   retractors and positioned the right hip in the extended and abducted position.  The final 2 Hi Tri Lock stem was   chosen and it was impacted down to the level of neck cut.  Based on this   and the trial reduction, a 32+1 delta ceramic ball was chosen and   impacted onto a clean and dry trunnion, and the hip was reduced.  The   hip had been irrigated throughout the case again at this point.  I did   reapproximate the superior capsular leaflet to the anterior leaflet   using #1 Vicryl.  The fascia of the   tensor fascia lata muscle was then reapproximated using #1 Vicryl and #0 V-lock sutures.  The   remaining wound was closed with 2-0 Vicryl and running 4-0 Monocryl.   The hip was cleaned,  dried, and dressed sterilely  using Dermabond and   Aquacel dressing.  She was then brought   to recovery room in stable condition tolerating the procedure well.    Lanney GinsMatthew Babish, PA-C was present for the entirety of the case involved from   preoperative positioning, perioperative retractor management, general   facilitation of the case, as well as primary wound closure as assistant.            Madlyn FrankelMatthew D. Charlann Boxerlin, M.D.        07/03/2015 2:01 PM

## 2015-07-03 NOTE — Progress Notes (Signed)
Utilization review completed.  

## 2015-07-03 NOTE — Anesthesia Preprocedure Evaluation (Signed)
Anesthesia Evaluation  Patient identified by MRN, date of birth, ID band Patient awake    Reviewed: Allergy & Precautions, H&P , NPO status , Patient's Chart, lab work & pertinent test results  History of Anesthesia Complications (+) PROLONGED EMERGENCE  Airway Mallampati: II  TM Distance: >3 FB Neck ROM: full    Dental  (+) Dental Advisory Given, Caps All upper front are capped:   Pulmonary neg pulmonary ROS, former smoker,    Pulmonary exam normal breath sounds clear to auscultation       Cardiovascular Exercise Tolerance: Good hypertension, Pt. on medications negative cardio ROS Normal cardiovascular exam Rhythm:regular Rate:Normal     Neuro/Psych negative neurological ROS  negative psych ROS   GI/Hepatic negative GI ROS, Neg liver ROS,   Endo/Other  negative endocrine ROS  Renal/GU negative Renal ROS  negative genitourinary   Musculoskeletal   Abdominal   Peds  Hematology negative hematology ROS (+)   Anesthesia Other Findings   Reproductive/Obstetrics negative OB ROS                             Anesthesia Physical Anesthesia Plan  ASA: II  Anesthesia Plan: Spinal   Post-op Pain Management:    Induction:   Airway Management Planned:   Additional Equipment:   Intra-op Plan:   Post-operative Plan:   Informed Consent: I have reviewed the patients History and Physical, chart, labs and discussed the procedure including the risks, benefits and alternatives for the proposed anesthesia with the patient or authorized representative who has indicated his/her understanding and acceptance.   Dental Advisory Given  Plan Discussed with: CRNA and Surgeon  Anesthesia Plan Comments:         Anesthesia Quick Evaluation

## 2015-07-04 DIAGNOSIS — E663 Overweight: Secondary | ICD-10-CM | POA: Diagnosis present

## 2015-07-04 LAB — CBC
HCT: 27.3 % — ABNORMAL LOW (ref 36.0–46.0)
HEMOGLOBIN: 9.4 g/dL — AB (ref 12.0–15.0)
MCH: 30.1 pg (ref 26.0–34.0)
MCHC: 34.4 g/dL (ref 30.0–36.0)
MCV: 87.5 fL (ref 78.0–100.0)
Platelets: 258 10*3/uL (ref 150–400)
RBC: 3.12 MIL/uL — AB (ref 3.87–5.11)
RDW: 12.8 % (ref 11.5–15.5)
WBC: 10.7 10*3/uL — AB (ref 4.0–10.5)

## 2015-07-04 LAB — BASIC METABOLIC PANEL
ANION GAP: 8 (ref 5–15)
BUN: 17 mg/dL (ref 6–20)
CHLORIDE: 104 mmol/L (ref 101–111)
CO2: 23 mmol/L (ref 22–32)
Calcium: 8.3 mg/dL — ABNORMAL LOW (ref 8.9–10.3)
Creatinine, Ser: 0.69 mg/dL (ref 0.44–1.00)
GFR calc Af Amer: >60 mL/min (ref >60)
Glucose, Bld: 130 mg/dL — ABNORMAL HIGH (ref 65–99)
POTASSIUM: 4 mmol/L (ref 3.5–5.1)
SODIUM: 135 mmol/L (ref 135–145)

## 2015-07-04 MED ORDER — DOCUSATE SODIUM 100 MG PO CAPS: 100.0000 mg | ORAL_CAPSULE | Freq: Two times a day (BID) | ORAL | Status: DC

## 2015-07-04 MED ORDER — ASPIRIN 325 MG PO TBEC: 325.0000 mg | DELAYED_RELEASE_TABLET | Freq: Two times a day (BID) | ORAL | Status: AC

## 2015-07-04 MED ORDER — FERROUS SULFATE 325 (65 FE) MG PO TABS: 325.0000 mg | ORAL_TABLET | Freq: Three times a day (TID) | ORAL | Status: DC

## 2015-07-04 MED ORDER — POLYETHYLENE GLYCOL 3350 17 G PO PACK: 17.0000 g | PACK | Freq: Two times a day (BID) | ORAL | Status: DC

## 2015-07-04 MED ORDER — TIZANIDINE HCL 4 MG PO TABS: 4.0000 mg | ORAL_TABLET | Freq: Four times a day (QID) | ORAL | Status: DC | PRN

## 2015-07-04 MED ORDER — HYDROCODONE-ACETAMINOPHEN 7.5-325 MG PO TABS: 1.0000 | ORAL_TABLET | ORAL | Status: DC | PRN

## 2015-07-04 NOTE — Discharge Instructions (Signed)

## 2015-07-04 NOTE — Evaluation (Signed)
Physical Therapy Evaluation Patient Details Name: Chanese Hartsough MRN: [id number] DOB: 1945/04/27 Today's Date: 07/04/2015   History of Present Illness  Pt is a 70 year old female s/p L DA THA  Clinical Impression  Patient evaluated by Physical Therapy with no further acute PT needs identified. All education has been completed and the patient has no further questions.  Pt mobilizing fairly well POD #1 and reports moderate pain with ambulation.  Pt performed exercises as well.  Pt reports spouse can assist as needed at home.  See below for any follow-up Physical Therapy or equipment needs. PT is signing off. Thank you for this referral.     Follow Up Recommendations Home health PT    Equipment Recommendations  None recommended by PT    Recommendations for Other Services       Precautions / Restrictions Precautions Precautions: Fall Restrictions Weight Bearing Restrictions: No      Mobility  Bed Mobility Overal bed mobility: Needs Assistance Bed Mobility: Supine to Sit     Supine to sit: Min assist     General bed mobility comments: pt up in recliner on arrival  Transfers Overall transfer level: Needs assistance Equipment used: Rolling walker (2 wheeled) Transfers: Sit to/from Stand Sit to Stand: Min guard;Supervision         General transfer comment: verbal cues for hand placement  Ambulation/Gait Ambulation/Gait assistance: Min guard;Supervision Ambulation Distance (Feet): 200 Feet Assistive device: Rolling walker (2 wheeled) Gait Pattern/deviations: Step-through pattern;Antalgic;Decreased stride length     General Gait Details: verbal cues for sequence, RW positioning, step length  Stairs Stairs: Yes Stairs assistance: Min guard Stair Management: Step to pattern;Forwards;One rail Left Number of Stairs: 2 General stair comments: verbal cues for sequence, safety, technique  Wheelchair Mobility    Modified Rankin (Stroke Patients Only)        Balance                                             Pertinent Vitals/Pain Pain Assessment: 0-10 Pain Score: 2  Pain Location: L hip Pain Descriptors / Indicators: Sore Pain Intervention(s): Limited activity within patient's tolerance;Monitored during session;Repositioned;Ice applied    Home Living Family/patient expects to be discharged to:: Private residence Living Arrangements: Spouse/significant other   Type of Home: House Home Access: Stairs to enter   Technical brewer of Steps: 1 Home Layout: Two level Home Equipment: Environmental consultant - 2 wheels      Prior Function Level of Independence: Independent               Hand Dominance        Extremity/Trunk Assessment   Upper Extremity Assessment: Overall WFL for tasks assessed           Lower Extremity Assessment: LLE deficits/detail   LLE Deficits / Details: observed decreased functional hip strength, requires assist for hip abduction and flexion     Communication   Communication: No difficulties  Cognition Arousal/Alertness: Awake/alert Behavior During Therapy: WFL for tasks assessed/performed Overall Cognitive Status: Within Functional Limits for tasks assessed                      General Comments      Exercises Total Joint Exercises Ankle Circles/Pumps: AROM;Both;5 reps Quad Sets: AROM;Both;10 reps Short Arc Quad: AROM;Left;10 reps Heel Slides: AAROM;Left;10 reps Hip ABduction/ADduction: AAROM;Left;10 reps Long Arc  Quad: AROM;Left;10 reps;Seated      Assessment/Plan    PT Assessment All further PT needs can be met in the next venue of care  PT Diagnosis Difficulty walking;Acute pain   PT Problem List Decreased strength;Decreased mobility;Pain;Decreased range of motion  PT Treatment Interventions     PT Goals (Current goals can be found in the Care Plan section) Acute Rehab PT Goals Patient Stated Goal: home PT Goal Formulation: All assessment and education  complete, DC therapy    Frequency     Barriers to discharge        Co-evaluation               End of Session   Activity Tolerance: Patient tolerated treatment well Patient left: in chair;with call bell/phone within reach;with family/visitor present           Time: 1583-0940 PT Time Calculation (min) (ACUTE ONLY): 20 min   Charges:   PT Evaluation $PT Eval Low Complexity: 1 Procedure     PT G Codes:        Justise Ehmann,KATHrine E 07/04/2015, 9:32 AM Carmelia Bake, PT, DPT 07/04/2015 Pager: [phone]

## 2015-07-04 NOTE — Progress Notes (Signed)
[facility]    [facility] the [facility] RW  If patient discharges after hours, please call [phone].   Renard HamperLecretia Williamson 07/04/2015, 10:13 AM

## 2015-07-04 NOTE — Evaluation (Signed)
Occupational Therapy Evaluation Patient Details Name: Amy Schmidt MRN: [id number] DOB: 13-Nov-1945 Today's Date: 07/04/2015    History of Present Illness s/p L DA THA   Clinical Impression   This 70 year old female was admitted for the above surgery. All education was completed. No further OT is needed at this time    Follow Up Recommendations  No OT follow up;Supervision/Assistance - 24 hour    Equipment Recommendations  None recommended by OT (has 3:1)    Recommendations for Other Services       Precautions / Restrictions Precautions Precautions: Fall Restrictions Weight Bearing Restrictions: No      Mobility Bed Mobility Overal bed mobility: Needs Assistance Bed Mobility: Supine to Sit     Supine to sit: Min assist     General bed mobility comments: for LLE  Transfers Overall transfer level: Needs assistance Equipment used: Rolling walker (2 wheeled) Transfers: Sit to/from Stand Sit to Stand: Min assist         General transfer comment: cues for UE/LE placement    Balance                                            ADL Overall ADL's : Needs assistance/impaired     Grooming: Set up;Oral care;Sitting           Upper Body Dressing : Set up;Sitting   Lower Body Dressing: Moderate assistance;Sit to/from stand   Toilet Transfer: Minimal assistance;Ambulation;BSC;RW             General ADL Comments: donned underwear from EOB.  Husband will assist as needed, and she has a Sports administrator at home, which she has been practicing with.  Practiced toilet transfer but had no needs. She plans to have a grab bar installed.  Educated on Landscape architect      Pertinent Vitals/Pain Pain Assessment: 0-10 Pain Score: 4  Pain Location: L hip also c/o numbness in hands at times Pain Descriptors / Indicators: Aching Pain Intervention(s): Limited activity within patient's tolerance;Monitored  during session;Premedicated before session;Repositioned;Ice applied     Hand Dominance     Extremity/Trunk Assessment Upper Extremity Assessment Upper Extremity Assessment: Overall WFL for tasks assessed           Communication Communication Communication: HOH;No difficulties   Cognition Arousal/Alertness: Awake/alert Behavior During Therapy: WFL for tasks assessed/performed Overall Cognitive Status: Within Functional Limits for tasks assessed                     General Comments       Exercises       Shoulder Instructions      Home Living Family/patient expects to be discharged to:: Private residence Living Arrangements: Spouse/significant other                 Bathroom Shower/Tub: Tub/shower unit (main floor; walk in shower downstairs) Shower/tub characteristics: Engineer, building services: Standard                Prior Functioning/Environment Level of Independence: Independent             OT Diagnosis: Generalized weakness   OT Problem List:     OT Treatment/Interventions:      OT Goals(Current goals can be found in the care plan section)  Acute Rehab OT Goals Patient Stated Goal: home  OT Frequency:     Barriers to D/C:            Co-evaluation              End of Session    Activity Tolerance: Patient tolerated treatment well Patient left: in chair;with call bell/phone within reach;with chair alarm set;with family/visitor present   Time: 7253-66440812-0844 OT Time Calculation (min): 32 min Charges:  OT General Charges $OT Visit: 1 Procedure OT Evaluation $OT Eval Low Complexity: 1 Procedure OT Treatments $Self Care/Home Management : 8-22 mins G-Codes:    Amy Schmidt 07/04/2015, 9:13 AM  Amy Schmidt, OTR/L [phone] 07/04/2015

## 2015-07-04 NOTE — Progress Notes (Signed)
     Subjective: 1 Day Post-Op Procedure(s) (LRB): LEFT TOTAL HIP ARTHROPLASTY ANTERIOR APPROACH (Left)   Patient reports pain as mild, pain controlled. No events throughout the night.  Looking forward to working with PT and recovering from surgery.  Ready to be discharged home.  Objective:   VITALS:   Filed Vitals:   07/04/15 0205 07/04/15 0531  BP: 129/75 127/78  Pulse: 84 81  Temp: 97.7 F (36.5 C) 98 F (36.7 C)  Resp: 16 16    Dorsiflexion/Plantar flexion intact Incision: dressing C/D/I No cellulitis present Compartment soft  LABS  Recent Labs  07/04/15 0421  HGB 9.4*  HCT 27.3*  WBC 10.7*  PLT 258     Recent Labs  07/04/15 0421  NA 135  K 4.0  BUN 17  CREATININE 0.69  GLUCOSE 130*     Assessment/Plan: 1 Day Post-Op Procedure(s) (LRB): LEFT TOTAL HIP ARTHROPLASTY ANTERIOR APPROACH (Left) Foley cath d/c'ed Advance diet Up with therapy D/C IV fluids Discharge home with home health  [facility] Follow up with OLIN,Lamekia Nolden D in 2 weeks.  Contact information:  [facility] [address], [address] [place] [id number] [phone]    Overweight (BMI 25-29.9) Estimated body mass index is 28.42 kg/(m^2) as calculated from the following:   Height as of this encounter: 5' 2.5" (1.588 m).   Weight as of this encounter: 71.668 kg (158 lb). Patient also counseled that weight may inhibit the healing process Patient counseled that losing weight will help with future health issues       Anastasio AuerbachMatthew S. Eather Chaires   PAC  07/04/2015, 9:46 AM

## 2015-07-04 NOTE — Care Management Note (Signed)
Case Management Note  Patient Details  Name: Amy Schmidt MRN: [id number] Date of Birth: Jul 20, 1945  Subjective/Objective:                  LEFT TOTAL HIP ARTHROPLASTY ANTERIOR APPROACH (Left) Action/Plan: Discharge planning Expected Discharge Date:  06/04/15               Expected Discharge Plan:  [place]  In-House Referral:     Discharge planning Services  CM Consult  Post Acute Care Choice:    Choice offered to:  Patient  DME Arranged:  Walker rolling DME Agency:  [place]:  Patient Refused [facility] Agency:  NA  Status of Service:  Completed, signed off  Medicare Important Message Given:    Date Medicare IM Given:    Medicare IM give by:    Date Additional Medicare IM Given:    Additional Medicare Important Message give by:     If discussed at [place] of Stay Meetings, dates discussed:    Additional Comments: CM met with pt in room to offer choice of home health agency.  Pt politely declines any HH services.  Pt lives with husband and states she plans to do the exercises given to her by PT.  Cm notified Babish, [place].  Cm called [facility] DME rep, Lecretia to please deliver the rolling walker to room so pt can discharge.  No other CM needs were communicated. Dellie Catholic, RN 07/04/2015, 9:44 AM

## 2015-07-09 NOTE — Discharge Summary (Signed)
Physician Discharge Summary  Patient ID: Amy Schmidt MRN: [id number] DOB/AGE: Aug 04, 1945 70 y.o.  Admit date: 07/03/2015 Discharge date: 07/04/2015   Procedures:  Procedure(s) (LRB): LEFT TOTAL HIP ARTHROPLASTY ANTERIOR APPROACH (Left)  Attending Physician:  Dr. Durene RomansMatthew Olin   Admission Diagnoses:   Left hip primary OA / pain  Discharge Diagnoses:  Principal Problem:   S/P left THA, AA Active Problems:   Overweight (BMI 25.0-29.9)  Past Medical History  Diagnosis Date  . Complication of anesthesia     SLOW TO WAKE UP  . Hypertension   . Hyperlipidemia   . Arthritis   . GERD (gastroesophageal reflux disease)   . Osteopenia   . Recurrent UTI     HPI:    Amy Schmidt, 70 y.o. female, has a history of pain and functional disability in the left hip(s) due to arthritis and patient has failed non-surgical conservative treatments for greater than 12 weeks to include NSAID's and/or analgesics, corticosteriod injections and activity modification. Onset of symptoms was gradual starting 5+ years ago with gradually worsening course since that time.The patient noted no past surgery on the right hip(s). Patient currently rates pain in the right hip at 7 out of 10 with activity. Patient has night pain, worsening of pain with activity and weight bearing, trendelenberg gait, pain that interfers with activities of daily living and pain with passive range of motion. Patient has evidence of periarticular osteophytes and joint space narrowing by imaging studies. This condition presents safety issues increasing the risk of falls. There is no current active infection. Risks, benefits and expectations were discussed with the patient. Risks including but not limited to the risk of anesthesia, blood clots, nerve damage, blood vessel damage, failure of the prosthesis, infection and up to and including death. Patient understand the risks, benefits and expectations and wishes to proceed with surgery.     PCP: Elise BennePRADHAN,PRADEEP K, MD   Discharged Condition: good  Hospital Course:  Patient underwent the above stated procedure on 07/03/2015. Patient tolerated the procedure well and brought to the recovery room in good condition and subsequently to the floor.  POD #1 BP: 127/78 ; Pulse: 81 ; Temp: 98 F (36.7 C) ; Resp: 16 Patient reports pain as mild, pain controlled. No events throughout the night. Looking forward to working with PT and recovering from surgery. Ready to be discharged home. Dorsiflexion/plantar flexion intact, incision: dressing C/D/I, no cellulitis present and compartment soft.   LABS  Basename    HGB     9.4  HCT     27.3    Discharge Exam: General appearance: alert, cooperative and no distress Extremities: Homans sign is negative, no sign of DVT, no edema, redness or tenderness in the calves or thighs and no ulcers, gangrene or trophic changes  Disposition: Home with follow up in 2 weeks   Follow-up Information    Follow up with [facility].   Why:  rolling walker   Contact information:   [address] [place] [address] [phone]       Follow up with Shelda PalLIN,Lejend Dalby D, MD. Schedule an appointment as soon as possible for a visit in 2 weeks.   Specialty:  Orthopedic Surgery   Contact information:   [address] [place] [address] [phone]       Discharge Instructions    Call MD / Call 911    Complete by:  As directed   If you experience chest pain or  shortness of breath, CALL 911 and be transported to the hospital emergency room.  If you develope a fever above 101 F, pus (white drainage) or increased drainage or redness at the wound, or calf pain, call your surgeon's office.     Change dressing    Complete by:  As directed   Maintain surgical dressing until follow up in the clinic. If the edges start to pull up, may reinforce with tape. If the dressing is no longer working, may remove and cover with  gauze and tape, but must keep the area dry and clean.  Call with any questions or concerns.     Constipation Prevention    Complete by:  As directed   Drink plenty of fluids.  Prune juice may be helpful.  You may use a stool softener, such as Colace (over the counter) 100 mg twice a day.  Use MiraLax (over the counter) for constipation as needed.     Diet - low sodium heart healthy    Complete by:  As directed      Discharge instructions    Complete by:  As directed   Maintain surgical dressing until follow up in the clinic. If the edges start to pull up, may reinforce with tape. If the dressing is no longer working, may remove and cover with gauze and tape, but must keep the area dry and clean.  Follow up in 2 weeks at [facility]. Call with any questions or concerns.     Increase activity slowly as tolerated    Complete by:  As directed   Weight bearing as tolerated with assist device (walker, cane, etc) as directed, use it as long as suggested by your surgeon or therapist, typically at least 4-6 weeks.     TED hose    Complete by:  As directed   Use stockings (TED hose) for 2 weeks on both leg(s).  You may remove them at night for sleeping.             Medication List    STOP taking these medications        diclofenac 75 MG EC tablet  Commonly known as:  VOLTAREN     naproxen 500 MG tablet  Commonly known as:  NAPROSYN     traMADol 50 MG tablet  Commonly known as:  ULTRAM     TYLENOL 8 HOUR ARTHRITIS PAIN 650 MG CR tablet  Generic drug:  acetaminophen      TAKE these medications        amLODipine 5 MG tablet  Commonly known as:  NORVASC  Take 5 mg by mouth at bedtime.     aspirin 325 MG EC tablet  Take 1 tablet (325 mg total) by mouth 2 (two) times daily.     atorvastatin 10 MG tablet  Commonly known as:  LIPITOR  Take 5 mg by mouth at bedtime.     b complex vitamins tablet  Take 1 tablet by mouth every morning.     CALCIUM 600 PO  Take 1 tablet  by mouth every morning.     cholecalciferol 1000 units tablet  Commonly known as:  VITAMIN D  Take 1,000 Units by mouth every morning.     Co Q-10 100 MG Caps  Take 100 mg by mouth every morning.     docusate sodium 100 MG capsule  Commonly known as:  COLACE  Take 1 capsule (100 mg total) by mouth 2 (two) times daily.  ferrous sulfate 325 (65 FE) MG tablet  Take 1 tablet (325 mg total) by mouth 3 (three) times daily after meals.     FISH OIL PO  Take 1 capsule by mouth every morning.     fluocinonide ointment 0.05 %  Commonly known as:  LIDEX  Apply 1 application topically 2 (two) times daily.     gabapentin 300 MG capsule  Commonly known as:  NEURONTIN  Take 300 mg by mouth 3 (three) times daily as needed. Pain.     glucosamine-chondroitin 500-400 MG tablet  Take 1 tablet by mouth daily with lunch.     HYDROcodone-acetaminophen 7.5-325 MG tablet  Commonly known as:  NORCO  Take 1-2 tablets by mouth every 4 (four) hours as needed for moderate pain.     lansoprazole 30 MG capsule  Commonly known as:  PREVACID  Take 30 mg by mouth daily.     lisinopril-hydrochlorothiazide 10-12.5 MG tablet  Commonly known as:  PRINZIDE,ZESTORETIC  Take 1 tablet by mouth every morning.     multivitamin with minerals Tabs tablet  Take 1 tablet by mouth every morning.     NASACORT ALLERGY 24HR 55 MCG/ACT Aero nasal inhaler  Generic drug:  triamcinolone  Place 2 sprays into the nose daily as needed (congestion.).     polyethylene glycol packet  Commonly known as:  MIRALAX / GLYCOLAX  Take 17 g by mouth 2 (two) times daily.     PROBIOTIC PO  Take 1 tablet by mouth daily with lunch.     tiZANidine 4 MG tablet  Commonly known as:  ZANAFLEX  Take 1 tablet (4 mg total) by mouth every 6 (six) hours as needed for muscle spasms.     Turmeric 500 MG Tabs  Take 1 tablet by mouth daily with lunch.     VISINE OP  Apply 1-2 drops to eye daily as needed (dry eyes.).     vitamin C 500  MG tablet  Commonly known as:  ASCORBIC ACID  Take 500 mg by mouth every morning.         Signed: Anastasio Auerbach. Yazmen Briones   PA-C  07/09/2015, 2:52 PM

## 2017-11-16 IMAGING — DX DG HIP (WITH OR WITHOUT PELVIS) 1V PORT*L*
2 series · 2 of 2 positions shown · non-contrast
Comparison: None.

CLINICAL DATA: Left hip replacement

EXAM:
DG HIP (WITH OR WITHOUT PELVIS) 1V PORT LEFT

[pelvis ap]
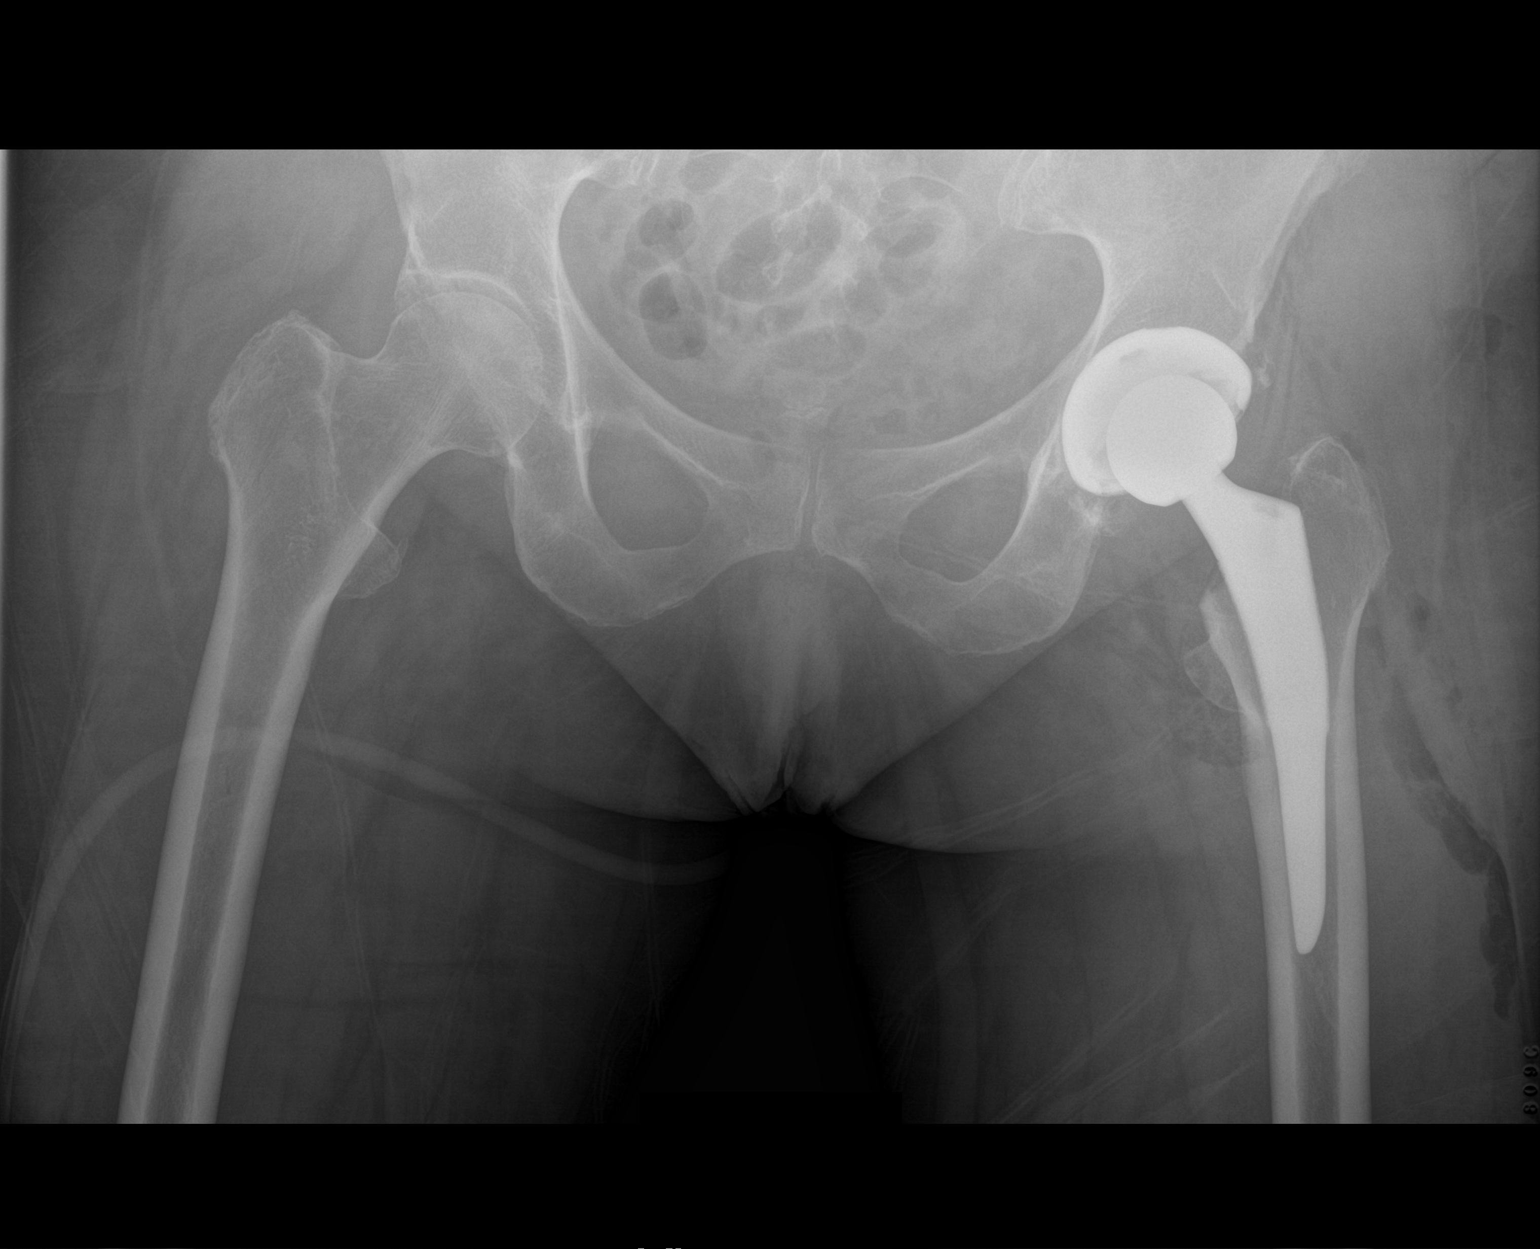

[hip frog leg]
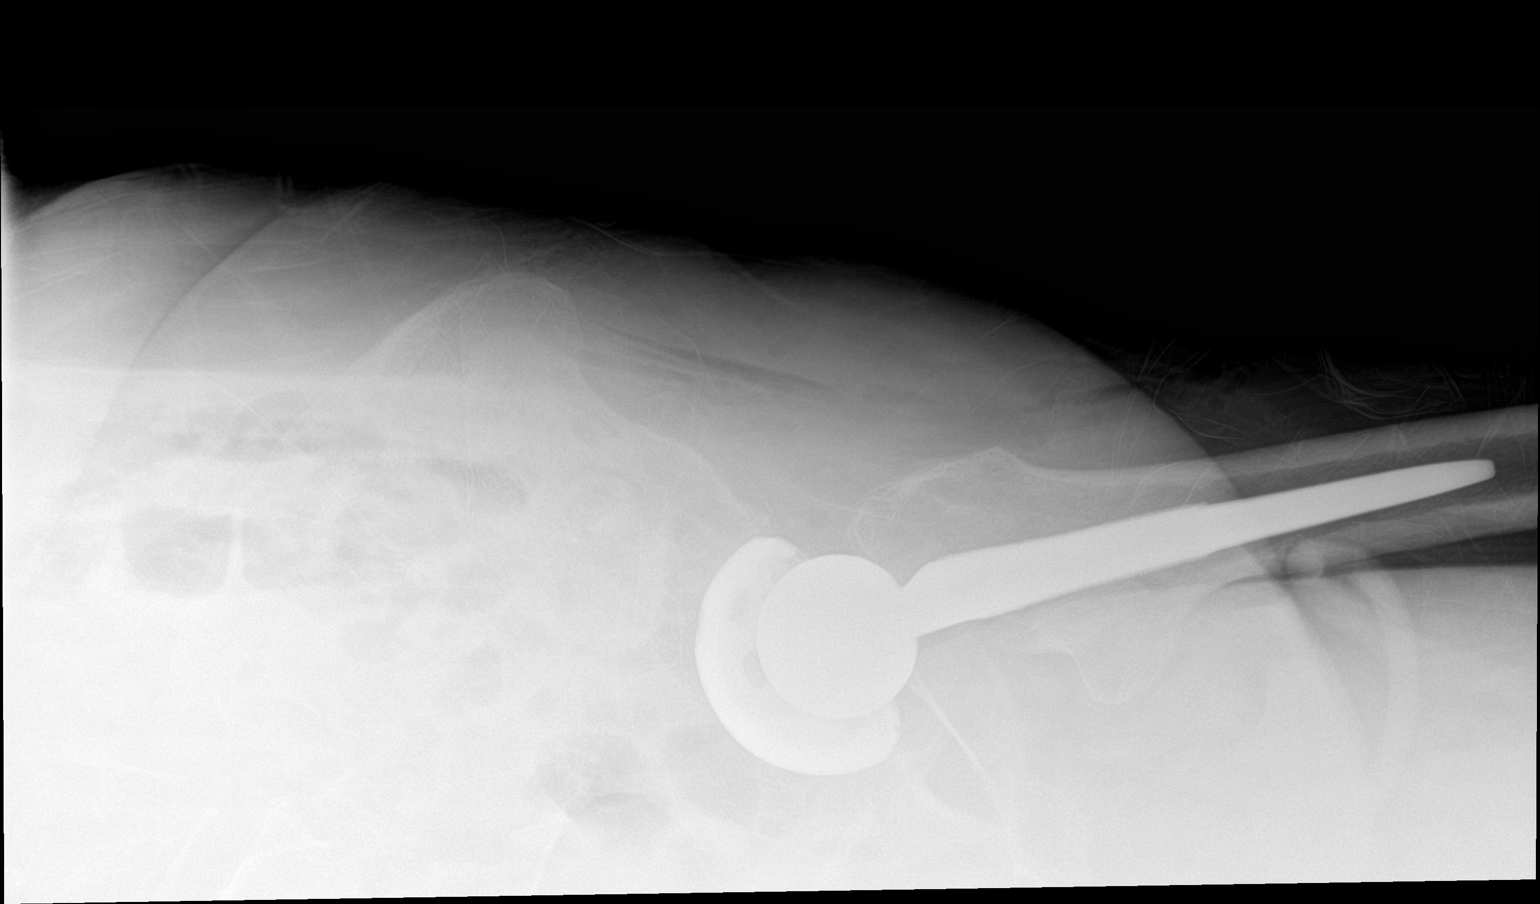

[2 of 2 positions shown; findings below may reference images not displayed]

FINDINGS: Interval total left hip arthroplasty without failure complication.
Postsurgical changes in the surrounding soft tissues. No acute
fracture or dislocation.
IMPRESSION: Interval left hip arthroplasty.

## 2020-12-26 ENCOUNTER — Encounter: Payer: Self-pay | Admitting: Neurology

## 2021-01-08 ENCOUNTER — Ambulatory Visit: Payer: Medicare Other | Admitting: Neurology

## 2021-01-13 NOTE — Progress Notes (Signed)
Assessment/Plan:   1. 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  We discussed treatment options as well as prognostic indicators.  Patient education was provided.  -We discussed that it used to be thought that levodopa would increase risk of melanoma but now it is believed that Parkinsons itself likely increases risk of melanoma. she is to get regular skin checks.  -Greater than 50% of the 60 minute visit was spent in counseling answering questions and talking about what to expect now as well as in the future.  We talked about medication options as well as potential future surgical options.  We talked about safety in the home.  -We decided to add carbidopa/levodopa 25/100.  1/2 tab tid x 1 wk, then 1/2 in am & noon & 1 at night for a week, then 1/2 in am &1 at noon &night for a week, then 1 po tid.  Risks, benefits, side effects and alternative therapies were discussed.  The opportunity to ask questions was given and they were answered to the best of my ability.  The patient expressed understanding and willingness to follow the outlined treatment protocols.  -met with my lcsw today  -We discussed community resources in the area including patient support groups and community exercise programs for PD and pt education was provided to the patient.   2.  L hand paresthesia, suspect carpal tunnel   -We will schedule EMG  Subjective:   Amy Schmidt was seen today in the movement disorders clinic for neurologic consultation at the request of Pradhan, Tilden Fossa, MD.  The consultation is for the evaluation of L hand rest tremor.  Outside records that were made available to me were reviewed.  This patient is accompanied in the office by her spouse who supplements the history.   Specific Symptoms:  Tremor: Yes.  , L hand tremor x 1 year - first noticed when driving, now with just sitting.  No left leg tremor.  She is L hand dominant.   No tremor with use. Family hx of similar:  No. Voice: no change - "I've always been low" Sleep: trouble sleeping due to hand paresthesia on the left  Vivid Dreams:  No.  Acting out dreams:  No. Wet Pillows: No. Postural symptoms:  No.  Falls?  No. Bradykinesia symptoms:  "my legs feel heavy" (attributes to R knee pain) Loss of smell:  No. Loss of taste:  No. Urinary Incontinence:  some urge incontinence Difficulty Swallowing:  No. Handwriting, micrographia: No. Trouble with ADL's:  No.  Trouble buttoning clothing: No. Depression:  No. Memory changes:  No. N/V:  No. Lightheaded:  No.  Syncope: No. Diplopia:  No. Dyskinesia:  No. Prior exposure to reglan/antipsychotics: No.  Neuroimaging of the brain has not previously been performed.     ALLERGIES:  No Known Allergies  CURRENT MEDICATIONS:  Current Outpatient Medications  Medication Instructions   amLODipine (NORVASC) 5 mg, Oral, Daily at bedtime   atorvastatin (LIPITOR) 5 mg, Oral, Daily at bedtime   b complex vitamins tablet 1 tablet, Oral, Every morning   Calcium Carbonate (CALCIUM 600 PO) 1 tablet, Oral, Every morning   cholecalciferol (VITAMIN D) 1,000 Units, Oral, Every morning   fluocinonide ointment (LIDEX) 9.76 % 1 application, Topical, 2 times daily   glucosamine-chondroitin 500-400 MG tablet 1 tablet, Oral, Daily with lunch   lansoprazole (PREVACID) 30 mg, Oral, Daily   lisinopril-hydrochlorothiazide (PRINZIDE,ZESTORETIC) 10-12.5 MG tablet 1 tablet, Oral,  Every morning   Multiple Vitamin (MULTIVITAMIN WITH MINERALS) TABS tablet 1 tablet, Oral, Every morning   Naproxen (NAPROSYN PO) Naprosyn   Probiotic Product (PROBIOTIC PO) 1 tablet, Oral, Daily with lunch   Tetrahydrozoline HCl (VISINE OP) 1-2 drops, Ophthalmic, Daily PRN   vitamin C (ASCORBIC ACID) 500 mg, Oral, Every morning    Objective:   VITALS:   Vitals:   01/16/21 1012  BP: (!) 158/84  Pulse: 74  SpO2: 97%  Weight: 153 lb 12.8 oz (69.8 kg)   Height: 5' 3" (1.6 m)    GEN:  The patient appears stated age and is in NAD. HEENT:  Normocephalic, atraumatic.  The mucous membranes are moist. The superficial temporal arteries are without ropiness or tenderness. CV:  RRR Lungs:  CTAB Neck/HEME:  There are no carotid bruits bilaterally.  Neurological examination:  Orientation: The patient is alert and oriented x3.  Cranial nerves: There is good facial symmetry. Extraocular muscles are intact. The visual fields are full to confrontational testing. The speech is fluent and clear. Soft palate rises symmetrically and there is no tongue deviation. Hearing is intact to conversational tone. Sensation: Sensation is intact to light and pinprick throughout (facial, trunk, extremities). Vibration is intact at the bilateral big toe. There is no extinction with double simultaneous stimulation. There is no sensory dermatomal level identified. Motor: Strength is 5/5 in the bilateral upper and lower extremities.   Shoulder shrug is equal and symmetric.  There is no pronator drift. Deep tendon reflexes: Deep tendon reflexes are 2/4 at the bilateral biceps, triceps, brachioradialis, patella and achilles. Plantar responses are downgoing bilaterally.  Movement examination: Tone: There is nl tone in the bilateral upper extremities.  The tone in the lower extremities is nl.  Abnormal movements: there is LUE rest tremor that increases with distraction procedures Coordination:  There is no decremation with RAM's, with any form of RAMS, including alternating supination and pronation of the forearm, hand opening and closing, finger taps, heel taps and toe taps. Gait and Station: The patient has no difficulty arising out of a deep-seated chair without the use of the hands. The patient's stride length is good but she slightly drags the left leg.  The arm swing is decreased on the right (not the left) and there is tremor on the L.     Total time spent on today's  visit was 60 minutes, including both face-to-face time and nonface-to-face time.  Time included that spent on review of records (prior notes available to me/labs/imaging if pertinent), discussing treatment and goals, answering patient's questions and coordinating care.  Cc:  Tobe Sos, MD

## 2021-01-16 ENCOUNTER — Other Ambulatory Visit: Payer: Self-pay

## 2021-01-16 ENCOUNTER — Ambulatory Visit: Payer: Medicare Other | Admitting: Neurology

## 2021-01-16 ENCOUNTER — Encounter: Payer: Self-pay | Admitting: Neurology

## 2021-01-16 VITALS — BP 158/84 | HR 74 | Ht 63.0 in | Wt 153.8 lb

## 2021-01-16 DIAGNOSIS — R202 Paresthesia of skin: Secondary | ICD-10-CM | POA: Diagnosis not present

## 2021-01-16 DIAGNOSIS — G20 Parkinson's disease: Secondary | ICD-10-CM | POA: Diagnosis not present

## 2021-01-16 MED ORDER — CARBIDOPA-LEVODOPA 25-100 MG PO TABS: 1.0000 | ORAL_TABLET | Freq: Three times a day (TID) | ORAL | 1 refills | Status: DC

## 2021-01-16 NOTE — Patient Instructions (Addendum)
Start Carbidopa Levodopa as follows: Take 1/2 tablet three times daily, at least 30 minutes before meals (approximately 6am/10am/3pm), for one week Then take 1/2 tablet in the morning, 1/2 tablet in the afternoon, 1 tablet in the evening, at least 30 minutes before meals, for one week Then take 1/2 tablet in the morning, 1 tablet in the afternoon, 1 tablet in the evening, at least 30 minutes before meals, for one week Then take 1 tablet three times daily at 6am/10am/3pm, at least 30 minutes before meals   As a reminder, carbidopa/levodopa can be taken at the same time as a carbohydrate, but we like to have you take your pill either 30 minutes before a protein source or 1 hour after as protein can interfere with carbidopa/levodopa absorption.

## 2021-01-17 ENCOUNTER — Encounter: Payer: Self-pay | Admitting: Neurology

## 2021-01-17 ENCOUNTER — Telehealth: Payer: Self-pay

## 2021-01-17 DIAGNOSIS — R202 Paresthesia of skin: Secondary | ICD-10-CM

## 2021-01-17 DIAGNOSIS — G20 Parkinson's disease: Secondary | ICD-10-CM

## 2021-01-17 NOTE — Telephone Encounter (Signed)
Called patient and offered her a second opinion. Patient wants to continue to see Dr. Arbutus Leas but I offered it to help make her decision. Patient nervous and having trouble with PD diagnosis. Told her to take her time and call me back and let me know whenever she is ready

## 2021-01-18 ENCOUNTER — Telehealth: Payer: Self-pay

## 2021-01-18 NOTE — Telephone Encounter (Signed)
Patient called back and would like a referral for a second opinion I will put that in and get it sent to [facility]

## 2021-01-18 NOTE — Addendum Note (Signed)
Addended by: Ila Mcgill C on: 01/18/2021 10:41 AM   Modules accepted: Orders

## 2021-01-18 NOTE — Telephone Encounter (Signed)
ERROR

## 2021-01-18 NOTE — Telephone Encounter (Signed)
Pt would like to speak with chelsea again about what they had discussed.

## 2021-01-18 NOTE — Telephone Encounter (Signed)
Patient has called In his morning and I called her back unable to reach her left voicemail message

## 2021-01-28 ENCOUNTER — Telehealth: Payer: Self-pay | Admitting: Neurology

## 2021-01-28 NOTE — Telephone Encounter (Signed)
Pt called in stating she inquired a little while back about getting a second opinion, but has not heard anything back about it.

## 2021-02-04 NOTE — Telephone Encounter (Signed)
Will send to [facility] for patient

## 2021-02-04 NOTE — Telephone Encounter (Signed)
Patient called and said she is wanting a referral for a second opinion to [facility] instead of [facility] since they haven't reached out to her for an appointment yet and it's been a month.

## 2021-02-12 ENCOUNTER — Other Ambulatory Visit: Payer: Self-pay

## 2021-02-12 ENCOUNTER — Ambulatory Visit: Payer: Medicare Other | Admitting: Neurology

## 2021-02-12 DIAGNOSIS — G5602 Carpal tunnel syndrome, left upper limb: Secondary | ICD-10-CM

## 2021-02-12 DIAGNOSIS — R202 Paresthesia of skin: Secondary | ICD-10-CM | POA: Diagnosis not present

## 2021-02-12 NOTE — Procedures (Signed)
[facility]  [address], [address]  [place], [place] [address] Tel: [phone] Fax:  [phone] Test Date:  02/12/2021  Patient: Amy Schmidt DOB: [birth year] Physician: Nita Sickle, DO  Sex: Female Height: 5\' 3"  Ref Phys: , D.O.  ID#: Kerin Salen   Technician:    Patient Complaints: This is a 75 year old female referred for evaluation of left hand paresthesias  NCV & EMG Findings: Extensive electrodiagnostic testing of the left upper extremity shows:  Left median sensory response shows prolonged latency (5.1 ms).  Left ulnar sensory responses within normal limits. Left median motor response shows prolonged latency (6.1 ms).  Left ulnar motor responses within normal limits.   There is no evidence of active or chronic motor axonal loss changes affecting any of the tested muscles.  Motor unit configuration and recruitment pattern is within normal limits.    Impression: Left median neuropathy at or distal to the wrist, consistent with a clinical diagnosis of carpal tunnel syndrome.  Overall, these findings are moderate in degree electrically.   ___________________________ 67, DO    Nerve Conduction Studies Anti Sensory Summary Table   Stim Site NR Peak (ms) Norm Peak (ms) P-T Amp (V) Norm P-T Amp  Left Median Anti Sensory (2nd Digit)  32C  Wrist    5.1 <3.8 15.7 >10  Left Ulnar Anti Sensory (5th Digit)  32C  Wrist    2.8 <3.2 25.1 >5   Motor Summary Table   Stim Site NR Onset (ms) Norm Onset (ms) O-P Amp (mV) Norm O-P Amp Site1 Site2 Delta-0 (ms) Dist (cm) Vel (m/s) Norm Vel (m/s)  Left Median Motor (Abd Poll Brev)  32C  Wrist    6.1 <4.0 6.7 >5 Elbow Wrist 5.0 28.0 56 >50  Elbow    11.1  6.3         Left Ulnar Motor (Abd Dig Minimi)  32C  Wrist    2.3 <3.1 9.5 >7 B Elbow Wrist 3.3 21.0 64 >50  B Elbow    5.6  9.0  A Elbow B Elbow 1.8 10.0 56 >50  A Elbow    7.4  8.3          EMG   Side Muscle Ins Act Fibs Psw Fasc Number  Recrt Dur Dur. Amp Amp. Poly Poly. Comment  Left 1stDorInt Nml Nml Nml Nml Nml Nml Nml Nml Nml Nml Nml Nml N/A  Left Abd Poll Brev Nml Nml Nml Nml Nml Nml Nml Nml Nml Nml Nml Nml N/A  Left PronatorTeres Nml Nml Nml Nml Nml Nml Nml Nml Nml Nml Nml Nml N/A  Left Biceps Nml Nml Nml Nml Nml Nml Nml Nml Nml Nml Nml Nml N/A  Left Triceps Nml Nml Nml Nml Nml Nml Nml Nml Nml Nml Nml Nml N/A  Left Deltoid Nml Nml Nml Nml Nml Nml Nml Nml Nml Nml Nml Nml N/A      Waveforms:

## 2021-02-18 ENCOUNTER — Telehealth: Payer: Self-pay

## 2021-02-18 ENCOUNTER — Telehealth: Payer: Self-pay | Admitting: Neurology

## 2021-02-18 DIAGNOSIS — G5602 Carpal tunnel syndrome, left upper limb: Secondary | ICD-10-CM

## 2021-02-18 MED ORDER — WRIST SPLINT/COCK-UP/LEFT SM MISC: 0 refills | Status: DC

## 2021-02-18 NOTE — Telephone Encounter (Signed)
Mailed patient her prescription for wrist splint. Called patient and she wanted to talk about getting her appointment at [facility] and that she is seeing PCP in a couple of days she is holding off on taking carbidopa levodopa at his time and wanted to let Dr. Arbutus Leas know

## 2021-02-18 NOTE — Telephone Encounter (Signed)
Pt advised please see result note.

## 2021-02-18 NOTE — Telephone Encounter (Signed)
Patient advised of her EMG results.and Dr.Tat recommendation to Neurosurgery for Consult.  Patient states since it is the holidays she prefers to think about it and deal with next year.  Patient would like the RX for Wrist splint. Please mail to patient at address on file.   Chelsea please call the patient. She would like to speak to you.

## 2021-02-18 NOTE — Telephone Encounter (Signed)
Pt is wanting the results for the EMG that was done on 02-12-21. Please call

## 2021-02-18 NOTE — Telephone Encounter (Signed)
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  Would she like referral to hand surgeon to discuss.  If so, refer to either Dr. Amanda Pea or Orlan Leavens.  If not, give her RX for cock up wrist splint to at least wear nighly on the L.

## 2021-03-03 HISTORY — PX: OTHER SURGICAL HISTORY: SHX169

## 2021-06-16 ENCOUNTER — Telehealth: Payer: Self-pay | Admitting: Neurology

## 2021-06-16 NOTE — Telephone Encounter (Signed)
I got notes from Neurology at [facility].  Looks like pt has chosen to stay at [facility] for her care and has scheduled f/u there in December.  She, therefore, doesn't need her May follow up with me.  Please cancel and let pt know and wish her well. ?

## 2021-06-17 NOTE — Telephone Encounter (Signed)
Patient states that she will cancel one of them in a week she is pretty sure she is going to stay here with [place]. She understands that going back and forth would not be good for her and is not allowed. So she will let us know if she picks [facility]  ?

## 2021-07-08 ENCOUNTER — Telehealth: Payer: Self-pay | Admitting: Neurology

## 2021-07-08 NOTE — Telephone Encounter (Signed)
EMG faxed to [facility] ?

## 2021-07-08 NOTE — Telephone Encounter (Signed)
Patient called and stated she had an EMG done in Dec.  She wanted to see if she could get that faxed to [facility]?  The fax # is [phone]. ?

## 2021-07-19 ENCOUNTER — Ambulatory Visit: Payer: Medicare Other | Admitting: Neurology

## 2021-08-26 NOTE — Progress Notes (Unsigned)
Assessment/Plan:   1.  Parkinsons Disease  -she is taking carbidopa/levodopa 25/100, 1 po tid.  Discussed to take it with carbohydrate to avoid the nausea.  She was hoping to perhaps d/c or decrease it but I didn't recommend that (nor did [facility])  -We discussed that it used to be thought that levodopa would increase risk of melanoma but now it is believed that Parkinsons itself likely increases risk of melanoma. she is to get regular skin checks.  She is doing that in [place]  -we discussed Parkinsons Disease GENEration study   Subjective:   Amy Schmidt was seen today in follow up for Parkinsons disease, diagnosed last visit, which was in November.  My previous records were reviewed prior to todays visit as well as outside records available to me.  She was started on levodopa last visit, but ultimately she decided to hold off on taking it until she got a second opinion.  She saw [facility] neurology on June 13, 2021.  Those records are reviewed.  Diagnosis was agreed upon.  She also agreed that the patient should start levodopa.  Patient stated today that she has been on it for 2 months.  She has some morning nausea with it.  She takes it on an empty stomach.  Tremor is not as bad.   No falls.  She is going to [employer] and is biking 3 days per week.  She did have an EMG done since last visit because of complaints of left hand paresthesias.  There was evidence of carpal tunnel syndrome on the left, overall moderate.  We recommended orthopedic surgery consultation, but if she did not want that, we recommended she wear a cock-up wrist splint.  She ultimately decided to try the wrist splint first.  She reports that she is going to see Dr. Amanda Pea on July 13.    Current prescribed movement disorder medications: Carbidopa/levodopa 25/100, 1 tablet 3 times per day    ALLERGIES:  No Known Allergies  CURRENT MEDICATIONS:  Current Meds  Medication Sig   amLODipine (NORVASC) 5 MG tablet Take 5 mg  by mouth at bedtime.   atorvastatin (LIPITOR) 10 MG tablet Take 5 mg by mouth at bedtime.   b complex vitamins tablet Take 1 tablet by mouth every morning.   Calcium Carbonate (CALCIUM 600 PO) Take 1 tablet by mouth every morning.   carbidopa-levodopa (SINEMET IR) 25-100 MG tablet Take 1 tablet by mouth 3 (three) times daily. 6am/10am/3pm   cholecalciferol (VITAMIN D) 1000 units tablet Take 1,000 Units by mouth every morning.   diclofenac (CATAFLAM) 50 MG tablet Take 1 tablet twice a day by oral route.   Elastic Bandages & Supports (WRIST SPLINT/COCK-UP/LEFT SM) MISC Wear nighly on the Left  wrist   fluocinonide ointment (LIDEX) 0.05 % Apply 1 application topically 2 (two) times daily.   lansoprazole (PREVACID) 30 MG capsule Take 30 mg by mouth daily.   lisinopril-hydrochlorothiazide (PRINZIDE,ZESTORETIC) 10-12.5 MG tablet Take 1 tablet by mouth every morning.   Multiple Vitamin (MULTIVITAMIN WITH MINERALS) TABS tablet Take 1 tablet by mouth every morning.   Naproxen (NAPROSYN PO) Naprosyn   Probiotic Product (PROBIOTIC PO) Take 1 tablet by mouth daily with lunch.   vitamin C (ASCORBIC ACID) 500 MG tablet Take 500 mg by mouth every morning.     Objective:   PHYSICAL EXAMINATION:    VITALS:   Vitals:   08/29/21 0900  BP: 134/81  Pulse: 70  SpO2: 100%  Weight: 150  lb (68 kg)  Height: 5\' 3"  (1.6 m)    GEN:  The patient appears stated age and is in NAD. HEENT:  Normocephalic, atraumatic.  The mucous membranes are moist. The superficial temporal arteries are without ropiness or tenderness. CV:  RRR Lungs:  CTAB Neck/HEME:  There are no carotid bruits bilaterally.  Neurological examination:  Orientation: The patient is alert and oriented x3. Cranial nerves: There is good facial symmetry with facial hypomimia. The speech is fluent and clear. Soft palate rises symmetrically and there is no tongue deviation. Hearing is intact to conversational tone. Sensation: Sensation is intact to  light touch throughout Motor: Strength is at least antigravity x4.  Movement examination: Tone: There is nl tone in the bilateral upper extremities.  The tone in the lower extremities is nl.  Abnormal movements: there is no tremor noted today Coordination:  There is no decremation with RAM's, with any form of RAMS, including alternating supination and pronation of the forearm, hand opening and closing, finger taps, heel taps and toe taps. Gait and Station: The patient has no difficulty arising out of a deep-seated chair without the use of the hands. The patient's stride length is good.  She has purposeful arm swing.  Pos pull test    Total time spent on today's visit was , including both face-to-face time and nonface-to-face time.  Time included that spent on review of records (prior notes available to me/labs/imaging if pertinent), discussing treatment and goals, answering patient's questions and coordinating care.  Cc:  , MD

## 2021-08-29 ENCOUNTER — Encounter: Payer: Self-pay | Admitting: Neurology

## 2021-08-29 ENCOUNTER — Ambulatory Visit: Payer: Medicare Other | Admitting: Neurology

## 2021-08-29 VITALS — BP 134/81 | HR 70 | Ht 63.0 in | Wt 150.0 lb

## 2021-08-29 DIAGNOSIS — G20 Parkinson's disease: Secondary | ICD-10-CM | POA: Diagnosis not present

## 2021-11-19 ENCOUNTER — Other Ambulatory Visit: Payer: Self-pay

## 2021-11-19 ENCOUNTER — Telehealth: Payer: Self-pay | Admitting: Neurology

## 2021-11-19 MED ORDER — CARBIDOPA-LEVODOPA 25-100 MG PO TABS: 1.0000 | ORAL_TABLET | Freq: Three times a day (TID) | ORAL | 0 refills | Status: DC

## 2021-11-19 NOTE — Telephone Encounter (Signed)
[facility]bidopa [facility] Patient called and notified it has been sent

## 2021-11-19 NOTE — Telephone Encounter (Signed)
Caller left a message stating she just opened her last bottle of medication and she's requesting [facility].

## 2022-01-30 ENCOUNTER — Other Ambulatory Visit: Payer: Self-pay | Admitting: Neurology

## 2022-01-30 DIAGNOSIS — G20A1 Parkinson's disease without dyskinesia, without mention of fluctuations: Secondary | ICD-10-CM

## 2022-03-07 NOTE — Progress Notes (Unsigned)
Assessment/Plan:   1.  Parkinsons Disease  -she is taking carbidopa/levodopa 25/100, 1 po tid. She looks well today!  -She follows with dermatology in [place].  2.  Knee pain  -following with Dr. Alvan Dame  3.  F/u 6-8 months  Subjective:   Amy Schmidt was seen today in follow up for Parkinsons disease, diagnosed last visit, which was in November.  My previous records were reviewed prior to todays visit as well as outside records available to me.  Patient is continuing on levodopa.  She has been doing fairly well.  She has had no falls.  No near syncope.  No hallucinations.  She did go on a cruise since our last visit and did well with that, with the exception of getting covid on the cruise.  She did have carpal tunnel surgery on the L with Dr. Amedeo Plenty since our last visit.  She is considering having sx on the right.  She is having knee pain.  Following with Dr. Alvan Dame  Current prescribed movement disorder medications: Carbidopa/levodopa 25/100, 1 tablet 3 times per day    ALLERGIES:  No Known Allergies  CURRENT MEDICATIONS:  Current Meds  Medication Sig   amLODipine (NORVASC) 5 MG tablet Take 5 mg by mouth at bedtime.   atorvastatin (LIPITOR) 10 MG tablet Take 5 mg by mouth at bedtime.   b complex vitamins tablet Take 1 tablet by mouth every morning.   Calcium Carbonate (CALCIUM 600 PO) Take 1 tablet by mouth every morning.   carbidopa-levodopa (SINEMET IR) 25-100 MG tablet TAKE 1 TABLET BY MOUTH 3 (THREE) TIMES DAILY. 6AM/10AM/3PM   cholecalciferol (VITAMIN D) 1000 units tablet Take 1,000 Units by mouth every morning.   diclofenac (CATAFLAM) 50 MG tablet Take 1 tablet twice a day by oral route.   fluocinonide ointment (LIDEX) 7.40 % Apply 1 application topically 2 (two) times daily.   lansoprazole (PREVACID) 30 MG capsule Take 30 mg by mouth daily.   lisinopril-hydrochlorothiazide (PRINZIDE,ZESTORETIC) 10-12.5 MG tablet Take 1 tablet by mouth every morning.   Multiple Vitamin  (MULTIVITAMIN WITH MINERALS) TABS tablet Take 1 tablet by mouth every morning.   Naproxen (NAPROSYN PO) Naprosyn   Probiotic Product (PROBIOTIC PO) Take 1 tablet by mouth daily with lunch.   vitamin C (ASCORBIC ACID) 500 MG tablet Take 500 mg by mouth every morning.     Objective:   PHYSICAL EXAMINATION:    VITALS:   Vitals:   03/11/22 0852  BP: 126/82  Pulse: 65  SpO2: 96%  Weight: 154 lb (69.9 kg)  Height: 5\' 3"  (1.6 m)     GEN:  The patient appears stated age and is in NAD. HEENT:  Normocephalic, atraumatic.  The mucous membranes are moist. The superficial temporal arteries are without ropiness or tenderness. CV:  RRR Lungs:  CTAB Neck/HEME:  There are no carotid bruits bilaterally.  Neurological examination:  Orientation: The patient is alert and oriented x3. Cranial nerves: There is good facial symmetry with facial hypomimia. The speech is fluent and clear. Soft palate rises symmetrically and there is no tongue deviation. Hearing is intact to conversational tone. Sensation: Sensation is intact to light touch throughout Motor: Strength is at least antigravity x4.  Movement examination: Tone: There is nl tone in the bilateral upper extremities.  The tone in the lower extremities is nl.  Abnormal movements: there is no tremor noted today Coordination:  There is no decremation with RAM's, with any form of RAMS, including alternating supination and pronation  of the forearm, hand opening and closing, finger taps, heel taps and toe taps. Gait and Station: The patient has an antalgic gait.     Cc:  Tobe Sos, MD

## 2022-03-11 ENCOUNTER — Ambulatory Visit: Payer: Medicare Other | Admitting: Neurology

## 2022-03-11 ENCOUNTER — Encounter: Payer: Self-pay | Admitting: Neurology

## 2022-03-11 VITALS — BP 126/82 | HR 65 | Ht 63.0 in | Wt 154.0 lb

## 2022-03-11 DIAGNOSIS — G20A1 Parkinson's disease without dyskinesia, without mention of fluctuations: Secondary | ICD-10-CM | POA: Diagnosis not present

## 2022-03-11 NOTE — Patient Instructions (Signed)
Continue carbidopa/levodopa 25/100, 1 tablet three times per day.    As a reminder, carbidopa/levodopa can be taken at the same time as a carbohydrate, but we like to have you take your pill either 30 minutes before a protein source or 1 hour after as protein can interfere with carbidopa/levodopa absorption.  Local and Online Resources for Power over Parkinson's Group  January 2024   LOCAL [place] PARKINSON'S GROUPS   Power over Parkinson's Group:    Power Over Parkinson's Patient Education Group will be Wednesday, January 10th-*Hybrid meting*- in person at [facility] location and via [facility], 2:00-3:00 pm.   Starting in November, Power over [employer] and Care Partner Groups will meet together, with plans for separate break out session for caregivers (*this will be evolving over the next few months) Upcoming Power over Parkinson's Meetings/Care Partner Support:  2nd Wednesdays of the month at 2 pm:   January 10th, February 14th Contact Amy Marriott at [email or web address] if interested in participating in this group    LOCAL EVENTS AND NEW OFFERINGS  New PWR! Moves [employer] Instructor-Led Classes offering at [employer]!  TUESDAYS and Wednesdays 1-2 pm.   Contact Synetta Shadow at  [email or web address]  or [phone] (Tuesday classes are modified for chair and standing only) Drumming for Parkinson's will be held on 2nd and 4th Mondays at 11:00 am.   Located at the [place] of the [employer] ([address] [place]  Contact Albertina Parr at [email or web address] or [phone]  [facility] Parkinson's Tai Chi Class, Mondays at 11 am.  Call [phone] for details Let's Try Pickleball-$25 for 6 weeks of Pickleball in [place].  Contact Lynwood Dawley for more details and for dates.  [email or web address] SAVE THE DATE and REGISTER:  [place] Chapter of Parkinson's Foundation:  Parkinson's Symposium.   Conversations about Parkinson's.  Saturday, May 03, 2022, 9:00 am-2:00 pm.  Arnaldo Natal and [address], *In person or online via [place]*.  Register at [email or web address] or call Lafonda Mosses at [phone].   ONLINE EDUCATION AND SUPPORT  Parkinson Foundation:  www.parkinson.org  PD Health at Home continues:  Mindfulness Mondays, Wellness Wednesdays, Fitness Fridays   Upcoming Education:    Engineer, mining your Voice:  Administrator. Wednesday, January 3rd,  1-2 pm  Managing Weight Loss & Retaining Muscle Mass.  Wednesday, Jan 10th, 1-2 pm Changes in Speech and Voice.  Wednesday, January 17th, 1-2 pm Register for virtual education and Photographer (webinars) at [email or web address] Please check out their website to sign up for emails and see their full online offerings      [employer] Foundation:  www.[email or web address]   Third Thursday Webinars:  On the third Thursday of every month at 12 p.m. ET, join our free live webinars to learn about various aspects of living with Parkinson's disease and our work to speed medical breakthroughs.  Upcoming Webinar:  New Year, New Moves!  Explore Exercise for Life with Parkinson's.  Thursday, January 18th at 12 noon. Check out additional information on their website to see their full online offerings    [facility]:  [email or web address]  Upcoming Webinar:   Physical Therapy and Parkinson's.  Thursday, January 11th, 2 pm  Webinar Series:  Living with Parkinson's Meetup.   Third Thursdays each month, 3 pm  Care Partner Monthly Meetup.  With Jillene Bucks [place].  First Tuesday of each month, 2 pm  Check out additional information to Live Well Today on their website    Parkinson and Movement Disorders (  PMD) Alliance:  www.pmdalliance.org  [employer] Online:  Online Education Events  Sign up for emails, which are sent weekly to give you updates on programming and  online offerings    Parkinson's Association of the [place]:  www.parkinsonassociation.org  Information on online support groups, education events, and online exercises including Yoga, Parkinson's exercises and more-LOTS of information on links to PD resources and online events  Virtual Support Group through Parkinson's Association of the [place]; next one is scheduled for Wednesday, Feb 7th  MOVEMENT AND EXERCISE OPPORTUNITIES  PWR! Moves Classes at [place].  Wednesdays 10 and 11 am.   Contact Amy Marriott, PT [email or web address] if interested.  NEW PWR! Moves Class offerings at [employer].  *TUESDAYS* and Wednesdays 1-2 pm.    Contact Vonna Kotyk at  [email or web address]    Parkinson's Wellness Recovery (PWR! Moves)  www.[email or web address]  Info on the PWR! Virtual Experience:  You will have access to our expertise?through self-assessment, guided plans that start with the PD-specific fundamentals, educational content, tips, Q&A with an expert, and a growing Art therapist of PD-specific pre-recorded and live exercise classes of varying types and intensity - both physical and cognitive! If that is not enough, we offer 1:1 wellness consultations (in-person or virtual) to personalize your PWR! Research scientist (medical).   [place] Fridays:   As part of the PD Health @ Home program, this free video series focuses each week on one aspect of fitness designed to support people living with Parkinson's.? These weekly videos highlight the [place] fitness guidelines for people with Parkinson's disease.  ModemGamers.si   Dance for PD website is offering free, live-stream classes throughout the week, as well as links to [employer] of classes:  [email or web address]  Virtual dance and Pilates for Parkinson's classes: Click on the Community Tab> Parkinson's Movement Initiative Tab.  To  register for classes and for more information, visit [email or web address] and click the "community" tab.   [employer] Parkinson's Cycling Classes   [employer]:  Thursdays @ Noon-Live classes at [employer] ([employer] at [email or web address]?or [phone])  [employer]: Virtual Classes Mondays and Thursdays Jeanette Caprice classes Tuesday, Wednesday and Thursday (contact [place] at [email or web address] ?or [phone])  [place]  Varied levels of classes are offered Tuesdays and Thursdays at [employer].   Stretching with Verdis Frederickson weekly class is also offered for people with Parkinson's  To observe a class or for more information, call [phone] or email Hezzie Bump at [email or web address]   ADDITIONAL SUPPORT AND RESOURCES  Well[employer]Spring [employer]:Online Caregiver Education Opportunities:  www.well-springsolutions.org/caregiver-education/[email or web address]-support-group.  You may also contact [facility] at [email or web address] or [phone].     Well-Spring Navigator:  Just1Navigator program, a?free service to help individuals and families through the journey of determining care for older adults.  The "Navigator" is a 05-03-1987, [phone], who will speak with a prospective client and/or loved ones to provide an assessment of the situation and a set of recommendations for a personalized care plan -- all free of charge, and whether?Well[employer]Spring [employer] offers the needed service or not. If the need is not a service we provide, we are well-connected with reputable programs in town that we can refer you to.  www.well-springsolutions.org or to speak with the Navigator, call [phone].

## 2022-04-16 ENCOUNTER — Other Ambulatory Visit: Payer: Self-pay | Admitting: Neurology

## 2022-04-16 DIAGNOSIS — G20A1 Parkinson's disease without dyskinesia, without mention of fluctuations: Secondary | ICD-10-CM

## 2022-07-14 ENCOUNTER — Other Ambulatory Visit: Payer: Self-pay | Admitting: Neurology

## 2022-07-14 DIAGNOSIS — G20A1 Parkinson's disease without dyskinesia, without mention of fluctuations: Secondary | ICD-10-CM

## 2022-10-06 ENCOUNTER — Other Ambulatory Visit: Payer: Self-pay | Admitting: Neurology

## 2022-10-06 DIAGNOSIS — G20A1 Parkinson's disease without dyskinesia, without mention of fluctuations: Secondary | ICD-10-CM

## 2022-10-16 ENCOUNTER — Ambulatory Visit: Payer: Medicare Other | Admitting: Neurology

## 2022-11-06 NOTE — Progress Notes (Signed)
Assessment/Plan:   1.  Parkinsons Disease  -she is taking carbidopa/levodopa 25/100, 1 po tid. She looks well today!  -She follows with dermatology in [place].  -She wonders if she has a disease, but explained to her that she just has very slow progression, which is great news.  She had a second opinion at [facility] and the diagnosis was agreed upon.  2.  Knee pain  -following with Dr. Charlann Boxer  3.  RLS  -pcp has add requip at bed.  She is only taking 0.5 mg, 1/2 po q hs  4.  HTN  -on multiple antihypertensives.    5.  Hand paresthesias  -wear cock up wrist splint at night  -f/u Dr. Amanda Pea  6.  Feet paresthesias  -check b12 (on supplement so may be high), spep/upep with immunofix; hgbA1C   Subjective:   Amy Schmidt was seen today in follow up for Parkinsons disease.  Outside records that were made available to me were reviewed.  Pt denies falls.  Pt denies lightheadedness, near syncope.  No hallucinations.  Mood has been good. Taking levodopa faithfully without SE.  She reports that pcp gave her q hs requip for rls.    She is on 3 total meds currently.  Running 120's at home.  No significant dizziness.  Noting paresthesias in the R hand only when sleeping.  Feeling is similar to how she felt prior to CTS on the other hand and she thinks that she may need the R hand done as well.  Some feeling of "thickness in the toes" at night.  Not bothersome.    Current prescribed movement disorder medications: Carbidopa/levodopa 25/100, 1 tablet 3 times per day Requip 0.5 mg q hs by pcp (she reports taking 1/2 po q hs)   ALLERGIES:  No Known Allergies  CURRENT MEDICATIONS:  Current Meds  Medication Sig   amLODipine (NORVASC) 5 MG tablet Take 5 mg by mouth at bedtime.   atorvastatin (LIPITOR) 10 MG tablet Take 5 mg by mouth at bedtime.   b complex vitamins tablet Take 1 tablet by mouth every morning.   Calcium Carbonate (CALCIUM 600 PO) Take 1 tablet by mouth every morning.    carbidopa-levodopa (SINEMET IR) 25-100 MG tablet TAKE 1 TABLET BY MOUTH 3 (THREE) TIMES DAILY. 6AM/10AM/3PM   cholecalciferol (VITAMIN D) 1000 units tablet Take 1,000 Units by mouth every morning.   diclofenac (CATAFLAM) 50 MG tablet Take 1 tablet twice a day by oral route.   fluocinonide ointment (LIDEX) 0.05 % Apply 1 application topically 2 (two) times daily.   lansoprazole (PREVACID) 30 MG capsule Take 30 mg by mouth daily.   lisinopril-hydrochlorothiazide (PRINZIDE,ZESTORETIC) 10-12.5 MG tablet Take 1 tablet by mouth every morning.   Multiple Vitamin (MULTIVITAMIN WITH MINERALS) TABS tablet Take 1 tablet by mouth every morning.   Naproxen (NAPROSYN PO) Naprosyn   Probiotic Product (PROBIOTIC PO) Take 1 tablet by mouth daily with lunch.   rOPINIRole (REQUIP) 0.5 MG tablet Take 0.5 mg by mouth at bedtime.   vitamin C (ASCORBIC ACID) 500 MG tablet Take 500 mg by mouth every morning.     Objective:   PHYSICAL EXAMINATION:    VITALS:   Vitals:   11/07/22 1105  BP: 110/72  Pulse: 82  SpO2: 96%  Weight: 158 lb 3.2 oz (71.8 kg)  Height: 5\' 4"  (1.626 m)      GEN:  The patient appears stated age and is in NAD. HEENT:  Normocephalic, atraumatic.  The mucous  membranes are moist. The superficial temporal arteries are without ropiness or tenderness. CV:  RRR Lungs:  CTAB Neck/HEME:  There are no carotid bruits bilaterally.  Neurological examination:  Orientation: The patient is alert and oriented x3. Cranial nerves: There is good facial symmetry with facial hypomimia. The speech is fluent and clear. Soft palate rises symmetrically and there is no tongue deviation. Hearing is intact to conversational tone. Sensation: Sensation is intact to light touch throughout Motor: Strength is at least antigravity x4.  Movement examination: Tone: There is nl tone in the bilateral upper extremities.  The tone in the lower extremities is nl.  Abnormal movements: there is no tremor noted  today Coordination:  There is no decremation with RAM's, with any form of RAMS, including alternating supination and pronation of the forearm, hand opening and closing, finger taps, heel taps and toe taps. Gait and Station: The patient has a wide based gait with purposeful arm swing  Total time spent on today's visit was 30 minutes, including both face-to-face time and nonface-to-face time.  Time included that spent on review of records (prior notes available to me/labs/imaging if pertinent), discussing treatment and goals, answering patient's questions and coordinating care.  Cc:  Elise Benne, MD

## 2022-11-07 ENCOUNTER — Ambulatory Visit: Payer: Medicare Other | Admitting: Neurology

## 2022-11-07 ENCOUNTER — Encounter: Payer: Self-pay | Admitting: Neurology

## 2022-11-07 ENCOUNTER — Other Ambulatory Visit (INDEPENDENT_AMBULATORY_CARE_PROVIDER_SITE_OTHER): Payer: Medicare Other

## 2022-11-07 VITALS — BP 110/72 | HR 82 | Ht 64.0 in | Wt 158.2 lb

## 2022-11-07 DIAGNOSIS — R739 Hyperglycemia, unspecified: Secondary | ICD-10-CM

## 2022-11-07 DIAGNOSIS — Z5181 Encounter for therapeutic drug level monitoring: Secondary | ICD-10-CM | POA: Diagnosis not present

## 2022-11-07 DIAGNOSIS — G20A1 Parkinson's disease without dyskinesia, without mention of fluctuations: Secondary | ICD-10-CM

## 2022-11-07 DIAGNOSIS — R6889 Other general symptoms and signs: Secondary | ICD-10-CM | POA: Diagnosis not present

## 2022-11-07 DIAGNOSIS — G609 Hereditary and idiopathic neuropathy, unspecified: Secondary | ICD-10-CM

## 2022-11-07 LAB — HEMOGLOBIN A1C: Hgb A1c MFr Bld: 5.8 % (ref 4.6–6.5)

## 2022-11-07 LAB — VITAMIN B12: Vitamin B-12: 696 pg/mL (ref 211–911)

## 2022-11-07 NOTE — Patient Instructions (Addendum)
SAVE THE DATE!  We are planning a Parkinsons Disease educational symposium at [facility] in [place] on October 11.  More details to come!  If you would like to be added to our email list to get further information, email [email or web address].  To sign up, you can email [email or web address].  We hope to see you there!  Your provider has requested that you have labwork completed today. The lab is located on the Second floor at [address], within the [facility] office. When you get off the elevator, turn right and go in the [facility] [address]; the first brown door on the left.  Tell the ladies behind the desk that you are there for lab work. If you are not called within 15 minutes please check with the front desk.   Once you complete your labs you are free to go. You will receive a call or message via MyChart with your lab results.

## 2022-11-12 LAB — PROTEIN ELECTROPHORESIS, URINE REFLEX
Albumin ELP, Urine: 35.6 %
Alpha-1-Globulin, U: 6.4 %
Alpha-2-Globulin, U: 13.5 %
Beta Globulin, U: 27.4 %
Gamma Globulin, U: 17.2 %
Protein, Ur: 13.5 mg/dL

## 2022-11-12 LAB — IMMUNOFIXATION, URINE

## 2022-11-14 LAB — IMMUNOFIXATION ELECTROPHORESIS
IgG (Immunoglobin G), Serum: 793 mg/dL (ref 600–1540)
IgM, Serum: 111 mg/dL (ref 50–300)
Immunoglobulin A: 129 mg/dL (ref 70–320)

## 2023-01-04 ENCOUNTER — Other Ambulatory Visit: Payer: Self-pay | Admitting: Neurology

## 2023-01-04 DIAGNOSIS — G20A1 Parkinson's disease without dyskinesia, without mention of fluctuations: Secondary | ICD-10-CM

## 2023-04-04 ENCOUNTER — Other Ambulatory Visit: Payer: Self-pay | Admitting: Neurology

## 2023-04-04 DIAGNOSIS — G20A1 Parkinson's disease without dyskinesia, without mention of fluctuations: Secondary | ICD-10-CM

## 2023-05-12 NOTE — Progress Notes (Unsigned)
 Assessment/Plan:   1.  Parkinsons Disease  -Continue carbidopa/levodopa 25/100, 1 tablet 3 times per day.  -She follows with dermatology in [place].  -She wonders if she has a disease, but explained to her that she just has very slow progression, which is great news.  She had a second opinion at [facility] and the diagnosis was agreed upon.  2.  Knee pain  -following with Dr. Charlann Boxer  3.  RLS  -pcp has add requip at bed.  She is only taking 0.5 mg, 1/2 po q hs  4.  HTN  -on multiple antihypertensives.    5.  Feet paresthesias  -lab work neg  -discussed EMG and she wants to hold on that for now.  Subjective:   Amy Schmidt was seen today in follow up for Parkinsons disease.  Outside records that were made available to me were reviewed.  Patient continues to do well on low-dose levodopa.  She has had no falls.  She is going to [employer] 3 days/week.  We did some lab work last visit regarding some paresthesias that she was having and that was unremarkable.  She still feels like she is having a "fat feeling" on the plantar aspect when takes shoes off at night especially.    Current prescribed movement disorder medications: Carbidopa/levodopa 25/100, 1 tablet 3 times per day Requip 0.5 mg q hs by pcp (she reports taking 1/2 po q hs)   ALLERGIES:  No Known Allergies  CURRENT MEDICATIONS:  Current Meds  Medication Sig   amLODipine (NORVASC) 5 MG tablet Take 5 mg by mouth at bedtime.   atorvastatin (LIPITOR) 10 MG tablet Take 5 mg by mouth at bedtime.   b complex vitamins tablet Take 1 tablet by mouth every morning.   Calcium Carbonate (CALCIUM 600 PO) Take 1 tablet by mouth every morning.   carbidopa-levodopa (SINEMET IR) 25-100 MG tablet TAKE 1 TABLET BY MOUTH 3 (THREE) TIMES DAILY. 6AM/10AM/3PM   cholecalciferol (VITAMIN D) 1000 units tablet Take 1,000 Units by mouth every morning.   diclofenac (CATAFLAM) 50 MG tablet Take 1 tablet twice a day by oral route.   fluocinonide  ointment (LIDEX) 0.05 % Apply 1 application topically 2 (two) times daily.   lansoprazole (PREVACID) 30 MG capsule Take 30 mg by mouth daily.   lisinopril-hydrochlorothiazide (PRINZIDE,ZESTORETIC) 10-12.5 MG tablet Take 1 tablet by mouth every morning.   Multiple Vitamin (MULTIVITAMIN WITH MINERALS) TABS tablet Take 1 tablet by mouth every morning.   Naproxen (NAPROSYN PO) Naprosyn   Probiotic Product (PROBIOTIC PO) Take 1 tablet by mouth daily with lunch.   rOPINIRole (REQUIP) 0.5 MG tablet Take 0.5 mg by mouth at bedtime.   vitamin C (ASCORBIC ACID) 500 MG tablet Take 500 mg by mouth every morning.     Objective:   PHYSICAL EXAMINATION:    VITALS:   Vitals:   05/14/23 1017  BP: 126/74  Pulse: 84  SpO2: 96%  Weight: 149 lb 6.4 oz (67.8 kg)    GEN:  The patient appears stated age and is in NAD. HEENT:  Normocephalic, atraumatic.  The mucous membranes are moist. The superficial temporal arteries are without ropiness or tenderness. CV:  RRR Lungs:  CTAB Neck/HEME:  There are no carotid bruits bilaterally.  Neurological examination:  Orientation: The patient is alert and oriented x3. Cranial nerves: There is good facial symmetry with facial hypomimia. The speech is fluent and clear. Soft palate rises symmetrically and there is no tongue deviation. Hearing is  intact to conversational tone. Sensation: Sensation is intact to light touch throughout Motor: Strength is at least antigravity x4.  Movement examination: Tone: There is nl tone in the bilateral upper extremities.  The tone in the lower extremities is nl.  Abnormal movements: there is no tremor noted today Coordination:  There is no decremation with RAM's, with any form of RAMS, including alternating supination and pronation of the forearm, hand opening and closing, finger taps, heel taps and toe taps. Gait and Station: The patient has a wide based gait with decreased arm swing on the R    Cc:  Pradhan, Sid Falcon, MD

## 2023-05-14 ENCOUNTER — Ambulatory Visit: Payer: Medicare Other | Admitting: Neurology

## 2023-05-14 ENCOUNTER — Encounter: Payer: Self-pay | Admitting: Neurology

## 2023-05-14 VITALS — BP 126/74 | HR 84 | Wt 149.4 lb

## 2023-05-14 DIAGNOSIS — G20A1 Parkinson's disease without dyskinesia, without mention of fluctuations: Secondary | ICD-10-CM | POA: Diagnosis not present

## 2023-07-04 ENCOUNTER — Other Ambulatory Visit: Payer: Self-pay | Admitting: Neurology

## 2023-07-04 DIAGNOSIS — G20A1 Parkinson's disease without dyskinesia, without mention of fluctuations: Secondary | ICD-10-CM

## 2023-10-02 HISTORY — PX: OTHER SURGICAL HISTORY: SHX169

## 2023-10-03 ENCOUNTER — Other Ambulatory Visit: Payer: Self-pay | Admitting: Neurology

## 2023-10-03 DIAGNOSIS — G20A1 Parkinson's disease without dyskinesia, without mention of fluctuations: Secondary | ICD-10-CM

## 2023-11-12 NOTE — Progress Notes (Signed)
 Assessment/Plan:   1.  Parkinsons Disease  -Continue carbidopa /levodopa  25/100, 1 tablet 3 times per day.  -She follows with dermatology in [place].  -She wonders if she has a disease, but explained to her that she just has very slow progression, which is great news.  She had a second opinion at [facility] and the diagnosis was agreed upon.  2.  Knee pain  -following with Dr. Ernie and scheduled for right knee arthroplasty September 23.  She is currently optimized from a Parkinson's standpoint for the surgery.  Reminded her to take her Parkinson's medicine the morning of surgery.  3.  RLS  -pcp is RX requip at bed.  She is only taking 0.5 mg, 1/2 po q hs  4.  HTN  -on multiple antihypertensives.  No evidence of any dysautonomia associated with Parkinson's.  5.  Feet paresthesias  -lab work neg  - She does not wish to pursue further testing.  Subjective:   Amy Schmidt was seen today in follow up for Parkinsons disease.  Outside records that were made available to me were reviewed.  Patient is on levodopa  3 times per day.  She has had no side effects with the medication.  She is taking it faithfully.  She has had no falls.  She is exercising faithfully.  No hallucinations.  No near syncope.  She is scheduled for knee surgery on September 23.  She just had CTR on 8/8.    Current prescribed movement disorder medications: Carbidopa /levodopa  25/100, 1 tablet 3 times per day Requip 0.5 mg q hs by pcp (she reports taking 1/2 po q hs)   ALLERGIES:  No Known Allergies  CURRENT MEDICATIONS:  Current Meds  Medication Sig   acetaminophen  (TYLENOL ) 650 MG CR tablet Take 1,300 mg by mouth every 8 (eight) hours as needed for pain.   amLODipine  (NORVASC ) 5 MG tablet Take 5 mg by mouth at bedtime.   atorvastatin  (LIPITOR) 10 MG tablet Take 5 mg by mouth at bedtime.   b complex vitamins tablet Take 1 tablet by mouth every morning.   Calcium  Carb-Cholecalciferol (CALCIUM  + VITAMIN D3 PO) Take  600 mg by mouth daily.   carbidopa -levodopa  (SINEMET  IR) 25-100 MG tablet TAKE 1 TABLET BY MOUTH 3 (THREE) TIMES DAILY. 6AM/10AM/3PM   cholecalciferol (VITAMIN D) 1000 units tablet Take 1,000 Units by mouth every morning.   Coenzyme Q10 (CO Q 10 PO) Take 200 mg by mouth daily.   diclofenac (CATAFLAM) 50 MG tablet Take 50 mg by mouth in the morning.   fluocinonide  ointment (LIDEX ) 0.05 % Apply 1 application  topically daily as needed (sore gums).   lansoprazole (PREVACID) 30 MG capsule Take 30 mg by mouth daily.   lisinopril-hydrochlorothiazide (PRINZIDE,ZESTORETIC) 10-12.5 MG tablet Take 1 tablet by mouth every morning.   Multiple Vitamins-Minerals (PRESERVISION AREDS 2 PO) Take 1 capsule by mouth 2 (two) times daily.   phenazopyridine (PYRIDIUM) 95 MG tablet Take 95 mg by mouth daily. AZO   Probiotic Product (PROBIOTIC PO) Take 1 tablet by mouth daily with lunch.   rOPINIRole (REQUIP) 0.5 MG tablet Take 0.5 mg by mouth at bedtime.   vitamin C (ASCORBIC ACID) 500 MG tablet Take 500 mg by mouth every morning.    Objective:   PHYSICAL EXAMINATION:    VITALS:   Vitals:   11/16/23 1053  BP: 134/76  Pulse: 67  SpO2: 97%  Weight: 145 lb (65.8 kg)  Height: 5' 2 (1.575 m)     GEN:  The patient appears stated age and is in NAD. HEENT:  Normocephalic, atraumatic.  The mucous membranes are moist. The superficial temporal arteries are without ropiness or tenderness. CV:  RRR Lungs:  CTAB Neck/HEME:  There are no carotid bruits bilaterally.  Neurological examination:  Orientation: The patient is alert and oriented x3. Cranial nerves: There is good facial symmetry with facial hypomimia. The speech is fluent and clear. Soft palate rises symmetrically and there is no tongue deviation. Hearing is intact to conversational tone. Sensation: Sensation is intact to light touch throughout Motor: Strength is at least antigravity x4.  Movement examination: Tone: There is nl tone in the bilateral  upper extremities.  The tone in the lower extremities is nl.  Abnormal movements: there is no tremor noted today Coordination:  There is no decremation with RAM's, with any form of RAMS, including alternating supination and pronation of the forearm, hand opening and closing, finger taps, heel taps and toe taps. Gait and Station: The patient has a wide based gait with decreased arm swing on the R.  This is stable  Total time spent on today's visit was 20 minutes, including both face-to-face time and nonface-to-face time.  Time included that spent on review of records (prior notes available to me/labs/imaging if pertinent), discussing treatment and goals, answering patient's questions and coordinating care.   Cc:  Pradhan, Pradeep K, MD

## 2023-11-13 NOTE — Progress Notes (Addendum)
 COVID Vaccine received:  []  No [x]  Yes Date of any COVID positive Test in last 90 days: no PCP - Laurance Rafter MD Cardiologist - Dr. Zagol @ [facility] in [place]. Neurology- Asberry Tat DO  Records requested from Cardiology 11/16/23  Chest x-ray -  EKG -   Stress Test - 09/03/23 [facility] ECHO - 08/26/23 [facility] Cardiac Cath -   Bowel Prep - [x]  No  []   Yes ______  Pacemaker / ICD device [x]  No []  Yes   Spinal Cord Stimulator:[x]  No []  Yes       History of Sleep Apnea? []  No [x]  Yes   CPAP used?- [x]  No []  Yes    Does the patient monitor blood sugar?          [x]  No []  Yes  []  N/A  Patient has: [x]  NO Hx DM   []  Pre-DM                 []  DM1  []   DM2 Does patient have a [employer] or [employer]om? []  No []  Yes   Fasting Blood Sugar Ranges-  Checks Blood Sugar _____ times a day  GLP1 agonist / usual dose - no GLP1 instructions:  SGLT-2 inhibitors / usual dose - no SGLT-2 instructions:   Blood Thinner / Instructions:no Aspirin  Instructions:no  Comments:   Activity level: Patient is able to climb a flight of stairs without difficulty; [x]  No CP  [x]  No SOB,    Patient can perform ADLs without assistance.   Anesthesia review: Parkinson's, OSA, HTN,  Patient denies shortness of breath, fever, cough and chest pain at PAT appointment.  Patient verbalized understanding and agreement to the Pre-Surgical Instructions that were given to them at this PAT appointment. Patient was also educated of the need to review these PAT instructions again prior to his/her surgery.I reviewed the appropriate phone numbers to call if they have any and questions or concerns.

## 2023-11-13 NOTE — Patient Instructions (Addendum)
 SURGICAL WAITING ROOM VISITATION  Patients having surgery or a procedure may have no more than 2 support people in the waiting area - these visitors may rotate.    Children under the age of 12 must have an adult with them who is not the patient.  Visitors with respiratory illnesses are discouraged from visiting and should remain at home.  If the patient needs to stay at the hospital during part of their recovery, the visitor guidelines for inpatient rooms apply. Pre-op nurse will coordinate an appropriate time for 1 support person to accompany patient in pre-op.  This support person may not rotate.    Please refer to the [facility] website for the visitor guidelines for Inpatients (after your surgery is over and you are in a regular room).       Your procedure is scheduled on: 11/24/23   Report to [facility] Main Entrance    Report to admitting at 8:45 AM   Call this number if you have problems the morning of surgery [phone]   Do not eat food :After Midnight.   After Midnight you may have the following liquids until 8:15 AM DAY OF SURGERY  Water Non-Citrus Juices (without pulp, NO RED-Apple, White grape, White cranberry) Black Coffee (NO MILK/CREAM OR CREAMERS, sugar ok)  Clear Tea (NO MILK/CREAM OR CREAMERS, sugar ok) regular and decaf                             Plain Jell-O (NO RED)                                           Fruit ices (not with fruit pulp, NO RED)                                     Popsicles (NO RED)                                                               Sports drinks like Gatorade (NO RED)                  The day of surgery:  Drink ONE (1) Pre-Surgery Clear Ensure at 8:15 AM the morning of surgery. Drink in one sitting. Do not sip.  This drink was given to you during your hospital  pre-op appointment visit. Nothing else to drink after completing the  Pre-Surgery Clear Ensure.    Oral Hygiene is also important to reduce your  risk of infection.                                    Remember - BRUSH YOUR TEETH THE MORNING OF SURGERY WITH YOUR REGULAR TOOTHPASTE   Stop all vitamins and herbal supplements 7 days before surgery.   Take these medicines the morning of surgery with A SIP OF WATER: amlodipine , atorvastatin , carbidopa -levodopa (sinemet ), Prevacid, Pyridium, Tylenol   Do not take Zestoretic(lisinopril-hydrochlorothiazide) the morning of surgery.  Bring CPAP mask and tubing day of surgery.                              You may not have any metal on your body including hair pins, jewelry, and body piercing             Do not wear make-up, lotions, powders, perfumes/cologne, or deodorant  Do not wear nail polish including gel and S&S, artificial/acrylic nails, or any other type of covering on natural nails including finger and toenails. If you have artificial nails, gel coating, etc. that needs to be removed by a nail salon please have this removed prior to surgery or surgery may need to be canceled/ delayed if the surgeon/ anesthesia feels like they are unable to be safely monitored.   Do not shave  48 hours prior to surgery.    Do not bring valuables to the hospital. [facility] IS NOT             RESPONSIBLE   FOR VALUABLES.   Contacts, glasses, dentures or bridgework may not be worn into surgery.   Bring small overnight bag day of surgery.   DO NOT BRING YOUR HOME MEDICATIONS TO THE HOSPITAL. PHARMACY WILL DISPENSE MEDICATIONS LISTED ON YOUR MEDICATION LIST TO YOU DURING YOUR ADMISSION IN THE HOSPITAL!    Patients discharged on the day of surgery will not be allowed to drive home.  Someone NEEDS to stay with you for the first 24 hours after anesthesia.   Special Instructions: Bring a copy of your healthcare power of attorney and living will documents the day of surgery if you haven't scanned them before.              Please read over the following fact sheets you were given: IF  YOU HAVE QUESTIONS ABOUT YOUR PRE-OP INSTRUCTIONS PLEASE CALL [phone] Amy Schmidt   If you received a COVID test during your pre-op visit  it is requested that you wear a mask when out in public, stay away from anyone that may not be feeling well and notify your surgeon if you develop symptoms. If you test positive for Covid or have been in contact with anyone that has tested positive in the last 10 days please notify you surgeon.      Pre-operative 5 CHG Bath Instructions   You can play a key role in reducing the risk of infection after surgery. Your skin needs to be as free of germs as possible. You can reduce the number of germs on your skin by washing with CHG (chlorhexidine  gluconate) soap before surgery. CHG is an antiseptic soap that kills germs and continues to kill germs even after washing.   DO NOT use if you have an allergy to chlorhexidine /CHG or antibacterial soaps. If your skin becomes reddened or irritated, stop using the CHG and notify one of our RNs at [phone].   Please shower with the CHG soap starting 4 days before surgery using the following schedule:     Please keep in mind the following:  DO NOT shave, including legs and underarms, starting the day of your first shower.   You may shave your face at any point before/day of surgery.  Place clean sheets on your bed the day you start using CHG soap. Use a clean washcloth (not used since being washed) for each shower. DO NOT sleep with pets  once you start using the CHG.   CHG Shower Instructions:  If you choose to wash your hair and private area, wash first with your normal shampoo/soap.  After you use shampoo/soap, rinse your hair and body thoroughly to remove shampoo/soap residue.  Turn the water OFF and apply about 3 tablespoons (45 ml) of CHG soap to a CLEAN washcloth.  Apply CHG soap ONLY FROM YOUR NECK DOWN TO YOUR TOES (washing for 3-5 minutes)  DO NOT use CHG soap on face, private areas, open wounds, or sores.   Pay special attention to the area where your surgery is being performed.  If you are having back surgery, having someone wash your back for you may be helpful. Wait 2 minutes after CHG soap is applied, then you may rinse off the CHG soap.  Pat dry with a clean towel  Put on clean clothes/pajamas   If you choose to wear lotion, please use ONLY the CHG-compatible lotions on the back of this paper.     Additional instructions for the day of surgery: DO NOT APPLY any lotions, deodorants, cologne, or perfumes.   Put on clean/comfortable clothes.  Brush your teeth.  Ask your nurse before applying any prescription medications to the skin.      CHG Compatible Lotions   Aveeno Moisturizing lotion  Cetaphil Moisturizing Cream  Cetaphil Moisturizing Lotion  Clairol Herbal Essence Moisturizing Lotion, Dry Skin  Clairol Herbal Essence Moisturizing Lotion, Extra Dry Skin  Clairol Herbal Essence Moisturizing Lotion, Normal Skin  Curel Age Defying Therapeutic Moisturizing Lotion with Alpha Hydroxy  Curel Extreme Care Body Lotion  Curel Soothing Hands Moisturizing Hand Lotion  Curel Therapeutic Moisturizing Cream, Fragrance-Free  Curel Therapeutic Moisturizing Lotion, Fragrance-Free  Curel Therapeutic Moisturizing Lotion, Original Formula  Eucerin Daily Replenishing Lotion  Eucerin Dry Skin Therapy Plus Alpha Hydroxy Crme  Eucerin Dry Skin Therapy Plus Alpha Hydroxy Lotion  Eucerin Original Crme  Eucerin Original Lotion  Eucerin Plus Crme Eucerin Plus Lotion  Eucerin TriLipid Replenishing Lotion  Keri Anti-Bacterial Hand Lotion  Keri Deep Conditioning Original Lotion Dry Skin Formula Softly Scented  Keri Deep Conditioning Original Lotion, Fragrance Free Sensitive Skin Formula  Keri Lotion Fast Absorbing Fragrance Free Sensitive Skin Formula  Keri Lotion Fast Absorbing Softly Scented Dry Skin Formula  Keri Original Lotion  Keri Skin Renewal Lotion Keri Silky Smooth Lotion  Keri Silky  Smooth Sensitive Skin Lotion  Nivea Body Creamy Conditioning Oil  Nivea Body Extra Enriched Lotion  Nivea Body Original Lotion  Nivea Body Sheer Moisturizing Lotion Nivea Crme  Nivea Skin Firming Lotion  NutraDerm 30 Skin Lotion  NutraDerm Skin Lotion  NutraDerm Therapeutic Skin Cream  NutraDerm Therapeutic Skin Lotion  ProShield Protective Hand Cream   Incentive Spirometer  An incentive spirometer is a tool that can help keep your lungs clear and active. This tool measures how well you are filling your lungs with each breath. Taking long deep breaths may help reverse or decrease the chance of developing breathing (pulmonary) problems (especially infection) following: A long period of time when you are unable to move or be active. BEFORE THE PROCEDURE  If the spirometer includes an indicator to show your best effort, your nurse or respiratory therapist will set it to a desired goal. If possible, sit up straight or lean slightly forward. Try not to slouch. Hold the incentive spirometer in an upright position. INSTRUCTIONS FOR USE  Sit on the edge of your bed if possible, or sit up as far as you  can in bed or on a chair. Hold the incentive spirometer in an upright position. Breathe out normally. Place the mouthpiece in your mouth and seal your lips tightly around it. Breathe in slowly and as deeply as possible, raising the piston or the ball toward the top of the column. Hold your breath for 3-5 seconds or for as long as possible. Allow the piston or ball to fall to the bottom of the column. Remove the mouthpiece from your mouth and breathe out normally. Rest for a few seconds and repeat Steps 1 through 7 at least 10 times every 1-2 hours when you are awake. Take your time and take a few normal breaths between deep breaths. The spirometer may include an indicator to show your best effort. Use the indicator as a goal to work toward during each repetition. After each set of 10 deep  breaths, practice coughing to be sure your lungs are clear. If you have an incision (the cut made at the time of surgery), support your incision when coughing by placing a pillow or rolled up towels firmly against it. Once you are able to get out of bed, walk around indoors and cough well. You may stop using the incentive spirometer when instructed by your caregiver.  RISKS AND COMPLICATIONS Take your time so you do not get dizzy or light-headed. If you are in pain, you may need to take or ask for pain medication before doing incentive spirometry. It is harder to take a deep breath if you are having pain. AFTER USE Rest and breathe slowly and easily. It can be helpful to keep track of a log of your progress. Your caregiver can provide you with a simple table to help with this. If you are using the spirometer at home, follow these instructions: SEEK MEDICAL CARE IF:  You are having difficultly using the spirometer. You have trouble using the spirometer as often as instructed. Your pain medication is not giving enough relief while using the spirometer. You develop fever of 100.5 F (38.1 C) or higher. SEEK IMMEDIATE MEDICAL CARE IF:  You cough up bloody sputum that had not been present before. You develop fever of 102 F (38.9 C) or greater. You develop worsening pain at or near the incision site. MAKE SURE YOU:  Understand these instructions. Will watch your condition. Will get help right away if you are not doing well or get worse. Document Released: 06/30/2006 Document Revised: 05/12/2011 Document Reviewed: 08/31/2006 [facility] Patient Information 2014 [place], [place].

## 2023-11-16 ENCOUNTER — Other Ambulatory Visit: Payer: Self-pay

## 2023-11-16 ENCOUNTER — Encounter: Payer: Self-pay | Admitting: Neurology

## 2023-11-16 ENCOUNTER — Encounter (HOSPITAL_COMMUNITY)
Admission: RE | Admit: 2023-11-16 | Discharge: 2023-11-16 | Disposition: A | Discharge status: Home / Self Care | Source: Ambulatory Visit | Attending: Orthopedic Surgery | Admitting: Orthopedic Surgery

## 2023-11-16 ENCOUNTER — Encounter (HOSPITAL_COMMUNITY): Payer: Self-pay

## 2023-11-16 ENCOUNTER — Ambulatory Visit: Admitting: Neurology

## 2023-11-16 VITALS — BP 123/67 | HR 73 | Temp 97.7°F | Resp 16 | Ht 62.0 in | Wt 145.0 lb

## 2023-11-16 VITALS — BP 134/76 | HR 67 | Ht 62.0 in | Wt 145.0 lb

## 2023-11-16 DIAGNOSIS — I10 Essential (primary) hypertension: Secondary | ICD-10-CM | POA: Diagnosis not present

## 2023-11-16 DIAGNOSIS — G20A1 Parkinson's disease without dyskinesia, without mention of fluctuations: Secondary | ICD-10-CM

## 2023-11-16 DIAGNOSIS — G8929 Other chronic pain: Secondary | ICD-10-CM

## 2023-11-16 DIAGNOSIS — Z01818 Encounter for other preprocedural examination: Secondary | ICD-10-CM | POA: Insufficient documentation

## 2023-11-16 DIAGNOSIS — G2581 Restless legs syndrome: Secondary | ICD-10-CM | POA: Diagnosis not present

## 2023-11-16 DIAGNOSIS — M25561 Pain in right knee: Secondary | ICD-10-CM

## 2023-11-16 HISTORY — DX: Myoneural disorder, unspecified: G70.9

## 2023-11-16 LAB — BASIC METABOLIC PANEL WITH GFR
Anion gap: 13 (ref 5–15)
BUN: 23 mg/dL (ref 8–23)
CO2: 22 mmol/L (ref 22–32)
Calcium: 9.5 mg/dL (ref 8.9–10.3)
Chloride: 95 mmol/L — ABNORMAL LOW (ref 98–111)
Creatinine, Ser: 0.93 mg/dL (ref 0.44–1.00)
GFR, Estimated: >60 mL/min (ref >60)
Glucose, Bld: 232 mg/dL — ABNORMAL HIGH (ref 70–99)
Potassium: 4 mmol/L (ref 3.5–5.1)
Sodium: 130 mmol/L — ABNORMAL LOW (ref 135–145)

## 2023-11-16 LAB — CBC
HCT: 36.4 % (ref 36.0–46.0)
Hemoglobin: 12 g/dL (ref 12.0–15.0)
MCH: 30.2 pg (ref 26.0–34.0)
MCHC: 33 g/dL (ref 30.0–36.0)
MCV: 91.7 fL (ref 80.0–100.0)
Platelets: 353 K/uL (ref 150–400)
RBC: 3.97 MIL/uL (ref 3.87–5.11)
RDW: 12.8 % (ref 11.5–15.5)
WBC: 7.7 K/uL (ref 4.0–10.5)
nRBC: 0 % (ref 0.0–0.2)

## 2023-11-16 LAB — SURGICAL PCR SCREEN
MRSA, PCR: NEGATIVE
Staphylococcus aureus: NEGATIVE

## 2023-11-16 NOTE — Patient Instructions (Signed)
 Join us  on social media and stay up to date with what is going on in the [place] regarding Parkinson's Disease and Movement Disorders!

## 2023-11-23 NOTE — H&P (Signed)
 TOTAL KNEE ADMISSION H&P  Patient is being admitted for right total knee arthroplasty.  Therapy Plans: outpatient therapy at [facility] Disposition: Home with husband Planned DVT Prophylaxis: aspirin  81mg  BID DME needed: none PCP: Dr. Toy - clearance received Cardio: Dr. Zagol - clearance received TXA: IV Allergies: NKDA Anesthesia Concerns: none BMI: 26 Last HgbA1c: not diabetic  Other: - staying overnight - No hx of VTE or cancer - hx of Parkinson's - oxycodone  (has full refill now from Dr. Camella), robaxin , tylenol , celebrex   Subjective:  Chief Complaint:right knee pain.  HPI: Amy Schmidt, 78 y.o. female, has a history of pain and functional disability in the right knee due to arthritis and has failed non-surgical conservative treatments for greater than 12 weeks to includeNSAID's and/or analgesics, corticosteriod injections, and activity modification.  Onset of symptoms was gradual, starting 2 years ago with gradually worsening course since that time. The patient noted no past surgery on the right knee(s).  Patient currently rates pain in the right knee(s) at 7 out of 10 with activity. Patient has worsening of pain with activity and weight bearing and pain that interferes with activities of daily living.  Patient has evidence of joint space narrowing by imaging studies.  There is no active infection.  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   Past Medical History:  Diagnosis Date   Arthritis    Complication of anesthesia    SLOW TO WAKE UP   GERD (gastroesophageal reflux disease)    Hyperlipidemia    Hypertension    Neuromuscular disorder (HCC)    Osteopenia    Recurrent UTI     Past Surgical History:  Procedure Laterality Date   BREAST SURGERY     BREAST BX - BENIGN   carpel Left 2023   carpel tunel Right 10/2023   CESAREAN SECTION      TOTAL HIP ARTHROPLASTY Left 07/03/2015   Procedure: LEFT TOTAL HIP ARTHROPLASTY ANTERIOR APPROACH;  Surgeon: Donnice Car, MD;  Location: [facility];  Service: Orthopedics;  Laterality: Left;    No current facility-administered medications for this encounter.   Current Outpatient Medications  Medication Sig Dispense Refill Last Dose/Taking   acetaminophen  (TYLENOL ) 650 MG CR tablet Take 1,300 mg by mouth every 8 (eight) hours as needed for pain.   Taking As Needed   amLODipine  (NORVASC ) 5 MG tablet Take 5 mg by mouth at bedtime.  3 Taking   atorvastatin  (LIPITOR) 10 MG tablet Take 5 mg by mouth at bedtime.  6 Taking   b complex vitamins tablet Take 1 tablet by mouth every morning.   Taking   Calcium  Carb-Cholecalciferol (CALCIUM  + VITAMIN D3 PO) Take 600 mg by mouth daily.   Taking   carbidopa -levodopa  (SINEMET  IR) 25-100 MG tablet TAKE 1 TABLET BY MOUTH 3 (THREE) TIMES DAILY. 6AM/10AM/3PM 270 tablet 0 Taking   cholecalciferol (VITAMIN D) 1000 units tablet Take 1,000 Units by mouth every morning.   Taking   Coenzyme Q10 (CO Q 10 PO) Take 200 mg by mouth daily.   Taking   diclofenac (CATAFLAM) 50 MG tablet Take 50 mg by mouth in the morning.   Taking   fluocinonide  ointment (LIDEX ) 0.05 % Apply 1 application  topically daily as needed (sore gums).  0 Taking As Needed   lansoprazole (PREVACID) 30 MG capsule Take 30 mg by mouth daily.  1 Taking  lisinopril -hydrochlorothiazide  (PRINZIDE ,ZESTORETIC ) 10-12.5 MG tablet Take 1 tablet by mouth every morning.  3 Taking   Multiple Vitamins-Minerals (PRESERVISION AREDS 2 PO) Take 1 capsule by mouth 2 (two) times daily.   Taking   phenazopyridine (PYRIDIUM) 95 MG tablet Take 95 mg by mouth daily. AZO   Taking   Probiotic Product (PROBIOTIC PO) Take 1 tablet by mouth daily with lunch.   Taking   rOPINIRole  (REQUIP ) 0.5 MG tablet Take 0.5 mg by mouth at bedtime.   Taking   vitamin C (ASCORBIC ACID) 500 MG tablet Take 500 mg by mouth every morning.   Taking    No Known Allergies  Social History   Tobacco Use   Smoking status: Former    Current packs/day: 0.00    Types: Cigarettes    Quit date: 06/22/2015    Years since quitting: 8.4   Smokeless tobacco: Never  Substance Use Topics   Alcohol use: Yes    Comment: OCCASIONAL BEER    Family History  Problem Relation Age of Onset   Heart failure Mother    Diabetes Mother    Lung disease Mother    Diabetes Father    Heart failure Father    Lung disease Father    Heart failure Brother    Diabetes Brother      Review of Systems  Constitutional:  Negative for chills and fever.  Respiratory:  Negative for cough and shortness of breath.   Cardiovascular:  Negative for chest pain.  Gastrointestinal:  Negative for nausea and vomiting.  Musculoskeletal:  Positive for arthralgias.     Objective:  Physical Exam Bilateral knee exams: No palpable effusions, warmth erythema Slight genu varum bilaterally Slight flexion contractures with flexion close to 120 degrees Tenderness over the medial and anterior aspect of the knees Stable medial and lateral collateral ligaments Neurovascular intact distally without lower extremity edema, erythema or calf tenderness  Vital signs in last 24 hours:    Labs:   Estimated body mass index is 26.52 kg/m as calculated from the following:   Height as of 11/16/23: 5' 2 (1.575 m).   Weight as of 11/16/23: 65.8 kg.   Imaging Review Plain radiographs demonstrate severe degenerative joint disease of the right knee(s). The overall alignment isneutral. The bone quality appears to be adequate for age and reported activity level.      Assessment/Plan:  End stage arthritis, right knee   The patient history, physical examination, clinical judgment of the provider and imaging studies are consistent with end stage degenerative joint disease of the right knee(s) and total knee arthroplasty is deemed medically necessary. The treatment options including  medical management, injection therapy arthroscopy and arthroplasty were discussed at length. The risks and benefits of total knee arthroplasty were presented and reviewed. The risks due to aseptic loosening, infection, stiffness, patella tracking problems, thromboembolic complications and other imponderables were discussed. The patient acknowledged the explanation, agreed to proceed with the plan and consent was signed. Patient is being admitted for inpatient treatment for surgery, pain control, PT, OT, prophylactic antibiotics, VTE prophylaxis, progressive ambulation and ADL's and discharge planning. The patient is planning to be discharged home.     Patient's anticipated LOS is less than 2 midnights, meeting these requirements: - Younger than 28 - Lives within 1 hour of care - Has a competent adult at home to recover with post-op recover - NO history of  - Chronic pain requiring opiods  - Diabetes  - Coronary Artery Disease  - Heart  failure  - Heart attack  - Stroke  - DVT/VTE  - Cardiac arrhythmia  - Respiratory Failure/COPD  - Renal failure  - Anemia  - Advanced Liver disease  Rosina Calin, PA-C Orthopedic Surgery [facility] [place] [phone]

## 2023-11-24 ENCOUNTER — Encounter (HOSPITAL_COMMUNITY): Payer: Self-pay | Admitting: Orthopedic Surgery

## 2023-11-24 ENCOUNTER — Ambulatory Visit (HOSPITAL_BASED_OUTPATIENT_CLINIC_OR_DEPARTMENT_OTHER): Admitting: Certified Registered Nurse Anesthetist

## 2023-11-24 ENCOUNTER — Other Ambulatory Visit: Payer: Self-pay

## 2023-11-24 ENCOUNTER — Ambulatory Visit (HOSPITAL_COMMUNITY): Payer: Self-pay | Admitting: Medical

## 2023-11-24 ENCOUNTER — Encounter (HOSPITAL_COMMUNITY)
Admission: RE | Disposition: A | Discharge status: Home / Self Care | Payer: Self-pay | Source: Ambulatory Visit | Attending: Orthopedic Surgery

## 2023-11-24 ENCOUNTER — Observation Stay (HOSPITAL_COMMUNITY)
Admission: RE | Admit: 2023-11-24 | Discharge: 2023-11-25 | Disposition: A | Discharge status: Home / Self Care | Source: Ambulatory Visit | Attending: Orthopedic Surgery | Admitting: Orthopedic Surgery

## 2023-11-24 DIAGNOSIS — Z96642 Presence of left artificial hip joint: Secondary | ICD-10-CM | POA: Diagnosis not present

## 2023-11-24 DIAGNOSIS — Z79899 Other long term (current) drug therapy: Secondary | ICD-10-CM | POA: Diagnosis not present

## 2023-11-24 DIAGNOSIS — M1711 Unilateral primary osteoarthritis, right knee: Secondary | ICD-10-CM

## 2023-11-24 DIAGNOSIS — Z87891 Personal history of nicotine dependence: Secondary | ICD-10-CM

## 2023-11-24 DIAGNOSIS — Z7982 Long term (current) use of aspirin: Secondary | ICD-10-CM | POA: Diagnosis not present

## 2023-11-24 DIAGNOSIS — Z96651 Presence of right artificial knee joint: Principal | ICD-10-CM

## 2023-11-24 DIAGNOSIS — I10 Essential (primary) hypertension: Secondary | ICD-10-CM | POA: Insufficient documentation

## 2023-11-24 DIAGNOSIS — M25561 Pain in right knee: Secondary | ICD-10-CM | POA: Diagnosis present

## 2023-11-24 HISTORY — PX: TOTAL KNEE ARTHROPLASTY: SHX125

## 2023-11-24 SURGERY — ARTHROPLASTY, KNEE, TOTAL
Anesthesia: Spinal | Site: Knee | Laterality: Right

## 2023-11-24 MED ORDER — DEXAMETHASONE SODIUM PHOSPHATE 10 MG/ML IJ SOLN
INTRAMUSCULAR | Status: AC
  Filled 2023-11-24: qty 1

## 2023-11-24 MED ORDER — CHLORHEXIDINE GLUCONATE 0.12 % MT SOLN
15.0000 mL | Freq: Once | OROMUCOSAL | Status: AC
  Administered 2023-11-24: 15 mL via OROMUCOSAL

## 2023-11-24 MED ORDER — ACETAMINOPHEN 500 MG PO TABS
1000.0000 mg | ORAL_TABLET | Freq: Four times a day (QID) | ORAL | Status: DC
Start: 2023-11-24 — End: 2023-11-28
  Administered 2023-11-24 – 2023-11-25 (×3): 1000 mg via ORAL
  Filled 2023-11-24 (×3): qty 2

## 2023-11-24 MED ORDER — ASPIRIN 81 MG PO CHEW
81.0000 mg | CHEWABLE_TABLET | Freq: Two times a day (BID) | ORAL | Status: DC
  Administered 2023-11-24 – 2023-11-25 (×2): 81 mg via ORAL
  Filled 2023-11-24 (×2): qty 1

## 2023-11-24 MED ORDER — DIPHENHYDRAMINE HCL 12.5 MG/5ML PO ELIX: 12.5000 mg | ORAL_SOLUTION | ORAL | Status: DC | PRN

## 2023-11-24 MED ORDER — LISINOPRIL 10 MG PO TABS
10.0000 mg | ORAL_TABLET | Freq: Every morning | ORAL | Status: DC
  Administered 2023-11-25: 10 mg via ORAL
  Filled 2023-11-24: qty 1

## 2023-11-24 MED ORDER — ALUM & MAG HYDROXIDE-SIMETH 200-200-20 MG/5ML PO SUSP: 30.0000 mL | ORAL | Status: DC | PRN

## 2023-11-24 MED ORDER — ATORVASTATIN CALCIUM 10 MG PO TABS
5.0000 mg | ORAL_TABLET | Freq: Every day | ORAL | Status: DC
  Administered 2023-11-24: 5 mg via ORAL
  Filled 2023-11-24: qty 1

## 2023-11-24 MED ORDER — SODIUM CHLORIDE 0.9 % IV SOLN: INTRAVENOUS | Status: DC

## 2023-11-24 MED ORDER — ROPINIROLE HCL 0.5 MG PO TABS
0.5000 mg | ORAL_TABLET | Freq: Every day | ORAL | Status: DC
  Administered 2023-11-24: 0.5 mg via ORAL
  Filled 2023-11-24: qty 1

## 2023-11-24 MED ORDER — LACTATED RINGERS IV SOLN: INTRAVENOUS | Status: DC

## 2023-11-24 MED ORDER — 0.9 % SODIUM CHLORIDE (POUR BTL) OPTIME
TOPICAL | Status: DC | PRN
  Administered 2023-11-24: 1000 mL

## 2023-11-24 MED ORDER — BUPIVACAINE IN DEXTROSE 0.75-8.25 % IT SOLN
INTRATHECAL | Status: DC | PRN
  Administered 2023-11-24: 1.6 mL via INTRATHECAL

## 2023-11-24 MED ORDER — OXYCODONE HCL 5 MG PO TABS: 10.0000 mg | ORAL_TABLET | ORAL | Status: DC | PRN

## 2023-11-24 MED ORDER — BISACODYL 10 MG RE SUPP: 10.0000 mg | Freq: Every day | RECTAL | Status: DC | PRN

## 2023-11-24 MED ORDER — PHENOL 1.4 % MT LIQD: 1.0000 | OROMUCOSAL | Status: DC | PRN

## 2023-11-24 MED ORDER — KETOROLAC TROMETHAMINE 30 MG/ML IJ SOLN
INTRAMUSCULAR | Status: AC
  Filled 2023-11-24: qty 1

## 2023-11-24 MED ORDER — POLYETHYLENE GLYCOL 3350 17 G PO PACK
17.0000 g | PACK | Freq: Two times a day (BID) | ORAL | Status: DC
  Administered 2023-11-24 – 2023-11-25 (×2): 17 g via ORAL
  Filled 2023-11-24 (×2): qty 1

## 2023-11-24 MED ORDER — PROPOFOL 1000 MG/100ML IV EMUL
INTRAVENOUS | Status: AC
  Filled 2023-11-24: qty 200

## 2023-11-24 MED ORDER — ONDANSETRON HCL 4 MG/2ML IJ SOLN: 4.0000 mg | Freq: Four times a day (QID) | INTRAMUSCULAR | Status: DC | PRN

## 2023-11-24 MED ORDER — OXYCODONE HCL 5 MG PO TABS: 5.0000 mg | ORAL_TABLET | Freq: Once | ORAL | Status: DC | PRN

## 2023-11-24 MED ORDER — MEPERIDINE HCL 100 MG/ML IJ SOLN: 6.2500 mg | INTRAMUSCULAR | Status: DC | PRN

## 2023-11-24 MED ORDER — LISINOPRIL-HYDROCHLOROTHIAZIDE 10-12.5 MG PO TABS: 1.0000 | ORAL_TABLET | Freq: Every morning | ORAL | Status: DC

## 2023-11-24 MED ORDER — PANTOPRAZOLE SODIUM 40 MG PO TBEC
40.0000 mg | DELAYED_RELEASE_TABLET | Freq: Every day | ORAL | Status: DC
Start: 2023-11-24 — End: 2023-11-25
  Administered 2023-11-24 – 2023-11-25 (×2): 40 mg via ORAL
  Filled 2023-11-24 (×2): qty 1

## 2023-11-24 MED ORDER — TRANEXAMIC ACID-NACL 1000-0.7 MG/100ML-% IV SOLN
1000.0000 mg | Freq: Once | INTRAVENOUS | Status: AC
Start: 2023-11-24 — End: 2023-11-24
  Administered 2023-11-24: 1000 mg via INTRAVENOUS
  Filled 2023-11-24: qty 100

## 2023-11-24 MED ORDER — DEXAMETHASONE SODIUM PHOSPHATE 10 MG/ML IJ SOLN
10.0000 mg | Freq: Once | INTRAMUSCULAR | Status: AC
Start: 2023-11-25 — End: 2023-11-25
  Administered 2023-11-25: 10 mg via INTRAVENOUS
  Filled 2023-11-24: qty 1

## 2023-11-24 MED ORDER — SODIUM CHLORIDE (PF) 0.9 % IJ SOLN
INTRAMUSCULAR | Status: DC | PRN
  Administered 2023-11-24: 61 mL

## 2023-11-24 MED ORDER — METOCLOPRAMIDE HCL 5 MG PO TABS: 5.0000 mg | ORAL_TABLET | Freq: Three times a day (TID) | ORAL | Status: DC | PRN

## 2023-11-24 MED ORDER — ACETAMINOPHEN 10 MG/ML IV SOLN: 1000.0000 mg | Freq: Once | INTRAVENOUS | Status: DC | PRN

## 2023-11-24 MED ORDER — MENTHOL 3 MG MT LOZG: 1.0000 | LOZENGE | OROMUCOSAL | Status: DC | PRN

## 2023-11-24 MED ORDER — ONDANSETRON HCL 4 MG/2ML IJ SOLN
INTRAMUSCULAR | Status: AC
  Filled 2023-11-24: qty 2

## 2023-11-24 MED ORDER — ONDANSETRON HCL 4 MG PO TABS: 4.0000 mg | ORAL_TABLET | Freq: Four times a day (QID) | ORAL | Status: DC | PRN

## 2023-11-24 MED ORDER — METOCLOPRAMIDE HCL 5 MG/ML IJ SOLN: 5.0000 mg | Freq: Three times a day (TID) | INTRAMUSCULAR | Status: DC | PRN

## 2023-11-24 MED ORDER — PHENYLEPHRINE HCL-NACL 20-0.9 MG/250ML-% IV SOLN
INTRAVENOUS | Status: DC | PRN
  Administered 2023-11-24: 40 ug/min via INTRAVENOUS

## 2023-11-24 MED ORDER — FENTANYL CITRATE PF 50 MCG/ML IJ SOSY
50.0000 ug | PREFILLED_SYRINGE | Freq: Once | INTRAMUSCULAR | Status: AC
  Administered 2023-11-24: 25 ug via INTRAVENOUS
  Filled 2023-11-24: qty 2

## 2023-11-24 MED ORDER — PROPOFOL 500 MG/50ML IV EMUL
INTRAVENOUS | Status: DC | PRN
  Administered 2023-11-24: 100 ug/kg/min via INTRAVENOUS

## 2023-11-24 MED ORDER — DEXAMETHASONE SODIUM PHOSPHATE 10 MG/ML IJ SOLN
INTRAMUSCULAR | Status: DC | PRN
  Administered 2023-11-24: 10 mg via INTRAVENOUS

## 2023-11-24 MED ORDER — EPHEDRINE SULFATE-NACL 50-0.9 MG/10ML-% IV SOSY
PREFILLED_SYRINGE | INTRAVENOUS | Status: DC | PRN
  Administered 2023-11-24: 10 mg via INTRAVENOUS

## 2023-11-24 MED ORDER — METHOCARBAMOL 1000 MG/10ML IJ SOLN: 500.0000 mg | Freq: Four times a day (QID) | INTRAMUSCULAR | Status: DC | PRN

## 2023-11-24 MED ORDER — MIDAZOLAM HCL 2 MG/2ML IJ SOLN
1.0000 mg | Freq: Once | INTRAMUSCULAR | Status: AC
  Administered 2023-11-24: 1 mg via INTRAVENOUS
  Filled 2023-11-24: qty 2

## 2023-11-24 MED ORDER — DEXAMETHASONE SODIUM PHOSPHATE 10 MG/ML IJ SOLN: 8.0000 mg | Freq: Once | INTRAMUSCULAR | Status: DC

## 2023-11-24 MED ORDER — OXYCODONE HCL 5 MG PO TABS
5.0000 mg | ORAL_TABLET | ORAL | Status: DC | PRN
  Administered 2023-11-25 (×2): 5 mg via ORAL
  Filled 2023-11-24 (×2): qty 1

## 2023-11-24 MED ORDER — ROPIVACAINE HCL 5 MG/ML IJ SOLN
INTRAMUSCULAR | Status: DC | PRN
  Administered 2023-11-24: 30 mL via PERINEURAL

## 2023-11-24 MED ORDER — BUPIVACAINE-EPINEPHRINE (PF) 0.25% -1:200000 IJ SOLN
INTRAMUSCULAR | Status: AC
  Filled 2023-11-24: qty 30

## 2023-11-24 MED ORDER — EPHEDRINE 5 MG/ML INJ
INTRAVENOUS | Status: AC
  Filled 2023-11-24: qty 5

## 2023-11-24 MED ORDER — ONDANSETRON HCL 4 MG/2ML IJ SOLN
INTRAMUSCULAR | Status: AC
Start: 2023-11-24 — End: 2023-11-24
  Filled 2023-11-24: qty 2

## 2023-11-24 MED ORDER — PROPOFOL 10 MG/ML IV BOLUS
INTRAVENOUS | Status: DC | PRN
Start: 2023-11-24 — End: 2023-11-24
  Administered 2023-11-24: 30 mg via INTRAVENOUS

## 2023-11-24 MED ORDER — SENNA 8.6 MG PO TABS
2.0000 | ORAL_TABLET | Freq: Every day | ORAL | Status: DC
  Administered 2023-11-24: 17.2 mg via ORAL
  Filled 2023-11-24: qty 2

## 2023-11-24 MED ORDER — AMLODIPINE BESYLATE 5 MG PO TABS
5.0000 mg | ORAL_TABLET | Freq: Every day | ORAL | Status: DC
Start: 2023-11-24 — End: 2023-11-25
  Administered 2023-11-24: 5 mg via ORAL
  Filled 2023-11-24: qty 1

## 2023-11-24 MED ORDER — HYDROCHLOROTHIAZIDE 12.5 MG PO TABS
12.5000 mg | ORAL_TABLET | Freq: Every morning | ORAL | Status: DC
  Administered 2023-11-25: 12.5 mg via ORAL
  Filled 2023-11-24: qty 1

## 2023-11-24 MED ORDER — OXYCODONE HCL 5 MG/5ML PO SOLN: 5.0000 mg | Freq: Once | ORAL | Status: DC | PRN

## 2023-11-24 MED ORDER — ORAL CARE MOUTH RINSE: 15.0000 mL | Freq: Once | OROMUCOSAL | Status: AC

## 2023-11-24 MED ORDER — HYDROMORPHONE HCL 1 MG/ML IJ SOLN: 0.2500 mg | INTRAMUSCULAR | Status: DC | PRN

## 2023-11-24 MED ORDER — TRANEXAMIC ACID-NACL 1000-0.7 MG/100ML-% IV SOLN
1000.0000 mg | INTRAVENOUS | Status: AC
  Administered 2023-11-24: 1000 mg via INTRAVENOUS
  Filled 2023-11-24: qty 100

## 2023-11-24 MED ORDER — HYDROMORPHONE HCL 1 MG/ML IJ SOLN: 0.5000 mg | INTRAMUSCULAR | Status: DC | PRN

## 2023-11-24 MED ORDER — CARBIDOPA-LEVODOPA 25-100 MG PO TABS
1.0000 | ORAL_TABLET | Freq: Three times a day (TID) | ORAL | Status: DC
  Administered 2023-11-24 – 2023-11-25 (×2): 1 via ORAL
  Filled 2023-11-24 (×3): qty 1

## 2023-11-24 MED ORDER — KETOROLAC TROMETHAMINE 15 MG/ML IJ SOLN
7.5000 mg | Freq: Four times a day (QID) | INTRAMUSCULAR | Status: AC
  Administered 2023-11-24 (×2): 7.5 mg via INTRAVENOUS
  Filled 2023-11-24 (×2): qty 1

## 2023-11-24 MED ORDER — CEFAZOLIN SODIUM-DEXTROSE 2-4 GM/100ML-% IV SOLN
2.0000 g | Freq: Four times a day (QID) | INTRAVENOUS | Status: AC
  Administered 2023-11-24 (×2): 2 g via INTRAVENOUS
  Filled 2023-11-24 (×2): qty 100

## 2023-11-24 MED ORDER — POVIDONE-IODINE 10 % EX SWAB: 2.0000 | Freq: Once | CUTANEOUS | Status: DC

## 2023-11-24 MED ORDER — SODIUM CHLORIDE 0.9 % IR SOLN
Status: DC | PRN
  Administered 2023-11-24: 3000 mL

## 2023-11-24 MED ORDER — ONDANSETRON HCL 4 MG/2ML IJ SOLN
INTRAMUSCULAR | Status: DC | PRN
  Administered 2023-11-24: 4 mg via INTRAVENOUS

## 2023-11-24 MED ORDER — PROPOFOL 1000 MG/100ML IV EMUL
INTRAVENOUS | Status: AC
  Filled 2023-11-24: qty 100

## 2023-11-24 MED ORDER — CEFAZOLIN SODIUM-DEXTROSE 2-4 GM/100ML-% IV SOLN
2.0000 g | INTRAVENOUS | Status: AC
  Administered 2023-11-24: 2 g via INTRAVENOUS
  Filled 2023-11-24: qty 100

## 2023-11-24 MED ORDER — METHOCARBAMOL 500 MG PO TABS: 500.0000 mg | ORAL_TABLET | Freq: Four times a day (QID) | ORAL | Status: DC | PRN

## 2023-11-24 MED ORDER — DROPERIDOL 2.5 MG/ML IJ SOLN: 0.6250 mg | Freq: Once | INTRAMUSCULAR | Status: DC | PRN

## 2023-11-24 MED ORDER — SODIUM CHLORIDE (PF) 0.9 % IJ SOLN
INTRAMUSCULAR | Status: AC
  Filled 2023-11-24: qty 50

## 2023-11-24 SURGICAL SUPPLY — 42 items
ATTUNE MED ANAT PAT 32 KNEE (Knees) IMPLANT
BAG COUNTER SPONGE SURGICOUNT (BAG) IMPLANT
BAG ZIPLOCK 12X15 (MISCELLANEOUS) ×1 IMPLANT
BASEPLATE TIB CMT FB PCKT SZ4 (Stem) IMPLANT
BLADE SAW SGTL 13.0X1.19X90.0M (BLADE) ×1 IMPLANT
BNDG ELASTIC 6INX 5YD STR LF (GAUZE/BANDAGES/DRESSINGS) ×1 IMPLANT
BOWL SMART MIX CTS (DISPOSABLE) ×1 IMPLANT
CEMENT HV SMART SET (Cement) ×2 IMPLANT
COMPONENT FEM CMT ATTN NRW 4RT (Joint) IMPLANT
COVER SURGICAL LIGHT HANDLE (MISCELLANEOUS) ×1 IMPLANT
CUFF TRNQT CYL 34X4.125X (TOURNIQUET CUFF) ×1 IMPLANT
DERMABOND ADVANCED .7 DNX12 (GAUZE/BANDAGES/DRESSINGS) ×1 IMPLANT
DRAPE U-SHAPE 47X51 STRL (DRAPES) ×1 IMPLANT
DRESSING AQUACEL AG SP 3.5X10 (GAUZE/BANDAGES/DRESSINGS) ×1 IMPLANT
DURAPREP 26ML APPLICATOR (WOUND CARE) ×2 IMPLANT
ELECT REM PT RETURN 15FT ADLT (MISCELLANEOUS) ×1 IMPLANT
GLOVE BIO SURGEON STRL SZ 6 (GLOVE) ×1 IMPLANT
GLOVE BIOGEL PI IND STRL 6.5 (GLOVE) ×1 IMPLANT
GLOVE BIOGEL PI IND STRL 7.5 (GLOVE) ×1 IMPLANT
GLOVE ORTHO TXT STRL SZ7.5 (GLOVE) ×2 IMPLANT
GOWN STRL REUS W/ TWL LRG LVL3 (GOWN DISPOSABLE) ×2 IMPLANT
INSERT TIB ATTUNE KNEE 4 8 RT (Insert) IMPLANT
KIT TURNOVER KIT A (KITS) ×1 IMPLANT
MANIFOLD NEPTUNE II (INSTRUMENTS) ×1 IMPLANT
NDL SAFETY ECLIPSE 18X1.5 (NEEDLE) IMPLANT
NS IRRIG 1000ML POUR BTL (IV SOLUTION) ×1 IMPLANT
PACK TOTAL KNEE CUSTOM (KITS) ×1 IMPLANT
PENCIL SMOKE EVACUATOR (MISCELLANEOUS) ×1 IMPLANT
PIN FIX SIGMA LCS THRD HI (PIN) IMPLANT
PROTECTOR NERVE ULNAR (MISCELLANEOUS) ×1 IMPLANT
SET HNDPC FAN SPRY TIP SCT (DISPOSABLE) ×1 IMPLANT
SET PAD KNEE POSITIONER (MISCELLANEOUS) ×1 IMPLANT
SUT MNCRL AB 4-0 PS2 18 (SUTURE) ×1 IMPLANT
SUT STRATAFIX PDS+ 0 24IN (SUTURE) ×1 IMPLANT
SUT VIC AB 1 CT1 36 (SUTURE) ×1 IMPLANT
SUT VIC AB 2-0 CT1 TAPERPNT 27 (SUTURE) ×2 IMPLANT
SYR 3ML LL SCALE MARK (SYRINGE) ×1 IMPLANT
TOWEL GREEN STERILE FF (TOWEL DISPOSABLE) ×1 IMPLANT
TRAY FOLEY MTR SLVR 14FR STAT (SET/KITS/TRAYS/PACK) IMPLANT
TUBE SUCTION HIGH CAP CLEAR NV (SUCTIONS) ×1 IMPLANT
WATER STERILE IRR 1000ML POUR (IV SOLUTION) ×2 IMPLANT
WRAP KNEE MAXI GEL POST OP (GAUZE/BANDAGES/DRESSINGS) ×1 IMPLANT

## 2023-11-24 NOTE — Op Note (Signed)
 NAME:  Amy Schmidt                      MEDICAL RECORD NO.:  [id number]                             FACILITY:  [facility]      PHYSICIAN:  Donnice BIRCH. Ernie, M.D.  DATE OF BIRTH:  07/18/1945      DATE OF PROCEDURE:  11/24/2023                                     OPERATIVE REPORT         PREOPERATIVE DIAGNOSIS:  Right knee osteoarthritis.      POSTOPERATIVE DIAGNOSIS:  Right knee osteoarthritis.      FINDINGS:  The patient was noted to have complete loss of cartilage and   bone-on-bone arthritis with associated osteophytes in the medial and patellofemoral compartments of   the knee with a significant synovitis and associated effusion.  The patient had failed months of conservative treatment including medications, injection therapy, activity modification.     PROCEDURE:  Right total knee replacement.      COMPONENTS USED:  DePuy Attune FB CR MS knee   system, a size 4N femur, 4 tibia, size 8 mm CR MS AOX insert, and 35 anatomic patellar   button.      SURGEON:  Donnice BIRCH. Ernie, M.D.      ASSISTANT:  Rosina Calin, PA-C.      ANESTHESIA:  Regional and Spinal.      SPECIMENS:  None.      COMPLICATION:  None.      DRAINS:  None.  EBL: <100 cc      TOURNIQUET TIME:   Total Tourniquet Time Documented: Thigh (Right) - 26 minutes Total: Thigh (Right) - 26 minutes  .      The patient was stable to the recovery room.      INDICATION FOR PROCEDURE:  [name] is a 78 y.o. female patient of   mine.  The patient had been seen, evaluated, and treated for months conservatively in the   office with medication, activity modification, and injections.  The patient had   radiographic changes of bone-on-bone arthritis with endplate sclerosis and osteophytes noted.  Based on the radiographic changes and failed conservative measures, the patient   decided to proceed with definitive treatment, total knee replacement.  Risks of infection, DVT, component failure, need for revision surgery,  neurovascular injury were reviewed in the office setting.  The postop course was reviewed stressing the efforts to maximize post-operative satisfaction and function.  Consent was obtained for benefit of pain   relief.      PROCEDURE IN DETAIL:  The patient was brought to the operative theater.   Once adequate anesthesia, preoperative antibiotics, 2 gm of Ancef ,1 gm of Tranexamic Acid , and 10 mg of Decadron  administered, the patient was positioned supine with a right thigh tourniquet placed.  The  right lower extremity was prepped and draped in sterile fashion.  A time-   out was performed identifying the patient, planned procedure, and the appropriate extremity.      The right lower extremity was placed in the [facility] leg holder.  The leg was   exsanguinated, tourniquet elevated to 225 mmHg.  A midline incision was   made followed  by median parapatellar arthrotomy.  Following initial   exposure, attention was first directed to the patella.  Precut   measurement was noted to be 22-13 mm.  I resected down to 13 mm and used a   35 anatomic patellar button to restore patellar height as well as cover the cut surface.      The lug holes were drilled and a metal shim was placed to protect the   patella from retractors and saw blade during the procedure.      At this point, attention was now directed to the femur.  The femoral   canal was opened with a drill, irrigated to try to prevent fat emboli.  An   intramedullary rod was passed at 3 degrees valgus, 8 mm of bone was   resected off the distal femur.  Following this resection, the tibia was   subluxated anteriorly.  Using the extramedullary guide, 2 mm of bone was resected off   the proximal medial tibia.  We confirmed the gap would be   stable medially and laterally with a size 5 spacer block as well as confirmed that the tibial cut was perpendicular in the coronal plane, checking with an alignment rod.      Once this was done, I sized the femur  to be a size 4 in the anterior-   posterior dimension, chose a narrow component based on medial and   lateral dimension.  The size 4 rotation block was then pinned in   position anterior referenced using the C-clamp to set rotation.  The   anterior, posterior, and  chamfer cuts were made without difficulty nor   notching making certain that I was along the anterior cortex to help   with flexion gap stability.      The final box cut was made off the lateral aspect of distal femur.      At this point, the tibia was sized to be a size 4.  The size 4 tray was   then pinned in position through the medial third of the tubercle,   drilled, and keel punched.  Trial reduction was now carried with a 4 femur,  4 tibia, a size 8 mm CR MS insert, and the 35 anatomic patella botton.  The knee was brought to full extension with good flexion stability with the patella   tracking through the trochlea without application of pressure.  Given   all these findings the trial components removed.  Final components were   opened and cement was mixed.  The knee was irrigated with normal saline solution and pulse lavage.  The synovial lining was   then injected with 30 cc of 0.25% Marcaine  with epinephrine , 1 cc of Toradol  and 30 cc of NS for a total of 61 cc.     Final implants were then cemented onto cleaned and dried cut surfaces of bone with the knee brought to extension with a size 8 mm CR MS trial insert.      Once the cement had fully cured, excess cement was removed   throughout the knee.  I confirmed that I was satisfied with the range of   motion and stability, and the final size 8 mm CR MS AOX insert was chosen.  It was   placed into the knee.      The tourniquet had been let down at 26 minutes.  No significant   hemostasis was required.  The extensor mechanism was then reapproximated using #1  Vicryl and #1 Stratafix sutures with the knee   in flexion.  The   remaining wound was closed with 2-0 Vicryl  and running 4-0 Monocryl.   The knee was cleaned, dried, dressed sterilely using Dermabond and   Aquacel dressing.  The patient was then   brought to recovery room in stable condition, tolerating the procedure   well.   Please note that Physician Assistant, Rosina Calin, PA-C was present for the entirety of the case, and was utilized for pre-operative positioning, peri-operative retractor management, general facilitation of the procedure and for primary wound closure at the end of the case.              Donnice CORDOBA Ernie, M.D.    11/24/2023 12:43 PM

## 2023-11-24 NOTE — Transfer of Care (Signed)
 Immediate Anesthesia Transfer of Care Note  Patient: Amy Schmidt  Procedure(s) Performed: ARTHROPLASTY, KNEE, TOTAL (Right: Knee)  Patient Location: PACU  Anesthesia Type:Spinal  Level of Consciousness: awake, alert , and oriented  Airway & Oxygen Therapy: Patient Spontanous Breathing  Post-op Assessment: Report given to RN and Post -op Vital signs reviewed and stable  Post vital signs: Reviewed  Last Vitals:  Vitals Value Taken Time  BP 95/55 11/24/23 13:00  Temp    Pulse 77 11/24/23 13:03  Resp 13 11/24/23 13:03  SpO2 94 % 11/24/23 13:03  Vitals shown include unfiled device data.  Last Pain:  Vitals:   11/24/23 1044  PainSc: 0-No pain         Complications: No notable events documented.

## 2023-11-24 NOTE — Anesthesia Postprocedure Evaluation (Signed)
 Anesthesia Post Note  Patient: Amy Schmidt  Procedure(s) Performed: ARTHROPLASTY, KNEE, TOTAL (Right: Knee)     Patient location during evaluation: PACU Anesthesia Type: Spinal Level of consciousness: oriented and awake and alert Pain management: pain level controlled Vital Signs Assessment: post-procedure vital signs reviewed and stable Respiratory status: spontaneous breathing, respiratory function stable and patient connected to nasal cannula oxygen Cardiovascular status: blood pressure returned to baseline and stable Postop Assessment: no headache, no backache and no apparent nausea or vomiting Anesthetic complications: no   No notable events documented.  Last Vitals:  Vitals:   11/24/23 1544 11/24/23 1634  BP: 122/64   Pulse: 74   Resp: 16   Temp:  36.5 C  SpO2: 97%     Last Pain:  Vitals:   11/24/23 1634  TempSrc: Oral  PainSc:                  Amalya Salmons D Connell Bognar

## 2023-11-24 NOTE — Plan of Care (Signed)
  Problem: Clinical Measurements: Goal: Diagnostic test results will improve Outcome: Progressing Goal: Respiratory complications will improve Outcome: Progressing Goal: Cardiovascular complication will be avoided Outcome: Progressing   Problem: Activity: Goal: Risk for activity intolerance will decrease Outcome: Progressing

## 2023-11-24 NOTE — Discharge Instructions (Signed)
 Dr. Donnice Car Total Joint Specialist [facility] [address] [place], [place] [address] [phone]  Pain Regimen Instructions:  - Take tylenol  1000 mg every 6-8 hours around the clock  - Take the muscle relaxant around the clock  - Then take 5 mg (1 tablet) of oxycodone  every 4 hours as needed for severe pain  - Ice and elevate your leg as often as you can  Do not take over the counter pain medication until instructed by us .  If this is not controlling your pain, or you need refills - call our office at [phone] or send a message via the [employer] portal.   Take the stool softeners provided until you are having regular bowel movements, and then you may discontinue.    INSTRUCTIONS AFTER JOINT REPLACEMENT   Remove items at home which could result in a fall. This includes throw rugs or furniture in walking pathways ICE to the affected joint every three hours while awake for 30 minutes at a time, for at least the first 3-5 days, and then as needed for pain and swelling.  Continue to use ice for pain and swelling. You may notice swelling that will progress down to the foot and ankle.  This is normal after surgery.  Elevate your leg when you are not up walking on it.   Continue to use the breathing machine you got in the hospital (incentive spirometer) which will help keep your temperature down.  It is common for your temperature to cycle up and down following surgery, especially at night when you are not up moving around and exerting yourself.  The breathing machine keeps your lungs expanded and your temperature down.   DIET:  As you were doing prior to hospitalization, we recommend a well-balanced diet.  DRESSING / WOUND CARE / SHOWERING  Keep the surgical dressing until follow up.  The dressing is water  proof, so you can shower without any extra covering.  IF THE DRESSING FALLS OFF or the wound gets wet inside, change the dressing with sterile  gauze.  Please use good hand washing techniques before changing the dressing.  Do not use any lotions or creams on the incision until instructed by your surgeon.    ACTIVITY  Increase activity slowly as tolerated, but follow the weight bearing instructions below.   No driving for 6 weeks or until further direction given by your physician.  You cannot drive while taking narcotics.  No lifting or carrying greater than 10 lbs. until further directed by your surgeon. Avoid periods of inactivity such as sitting longer than an hour when not asleep. This helps prevent blood clots.  You may return to work once you are authorized by your doctor.     WEIGHT BEARING   Weight bearing as tolerated with assist device (walker, cane, etc) as directed, use it as long as suggested by your surgeon or therapist, typically at least 4-6 weeks.   EXERCISES  Results after joint replacement surgery are often greatly improved when you follow the exercise, range of motion and muscle strengthening exercises prescribed by your doctor. Safety measures are also important to protect the joint from further injury. Any time any of these exercises cause you to have increased pain or swelling, decrease what you are doing until you are comfortable again and then slowly increase them. If you have problems or questions, call your caregiver or physical therapist for advice.   Rehabilitation is important following a joint replacement. After  just a few days of immobilization, the muscles of the leg can become weakened and shrink (atrophy).  These exercises are designed to build up the tone and strength of the thigh and leg muscles and to improve motion. Often times heat used for twenty to thirty minutes before working out will loosen up your tissues and help with improving the range of motion but do not use heat for the first two weeks following surgery (sometimes heat can increase post-operative swelling).   These exercises can be  done on a training (exercise) mat, on the floor, on a table or on a bed. Use whatever works the best and is most comfortable for you.    Use music or television while you are exercising so that the exercises are a pleasant break in your day. This will make your life better with the exercises acting as a break in your routine that you can look forward to.   Perform all exercises about fifteen times, three times per day or as directed.  You should exercise both the operative leg and the other leg as well.  Exercises include:   Quad Sets - Tighten up the muscle on the front of the thigh (Quad) and hold for 5-10 seconds.   Straight Leg Raises - With your knee straight (if you were given a brace, keep it on), lift the leg to 60 degrees, hold for 3 seconds, and slowly lower the leg.  Perform this exercise against resistance later as your leg gets stronger.  Leg Slides: Lying on your back, slowly slide your foot toward your buttocks, bending your knee up off the floor (only go as far as is comfortable). Then slowly slide your foot back down until your leg is flat on the floor again.  Angel Wings: Lying on your back spread your legs to the side as far apart as you can without causing discomfort.  Hamstring Strength:  Lying on your back, push your heel against the floor with your leg straight by tightening up the muscles of your buttocks.  Repeat, but this time bend your knee to a comfortable angle, and push your heel against the floor.  You may put a pillow under the heel to make it more comfortable if necessary.   A rehabilitation program following joint replacement surgery can speed recovery and prevent re-injury in the future due to weakened muscles. Contact your doctor or a physical therapist for more information on knee rehabilitation.    CONSTIPATION  Constipation is defined medically as fewer than three stools per week and severe constipation as less than one stool per week.  Even if you have a regular  bowel pattern at home, your normal regimen is likely to be disrupted due to multiple reasons following surgery.  Combination of anesthesia, postoperative narcotics, change in appetite and fluid intake all can affect your bowels.   YOU MUST use at least one of the following options; they are listed in order of increasing strength to get the job done.  They are all available over the counter, and you may need to use some, POSSIBLY even all of these options:    Drink plenty of fluids (prune juice may be helpful) and high fiber foods Colace 100 mg by mouth twice a day  Senokot for constipation as directed and as needed Dulcolax (bisacodyl ), take with full glass of water   Miralax  (polyethylene glycol) once or twice a day as needed.  If you have tried all these things and are unable to  have a bowel movement in the first 3-4 days after surgery call either your surgeon or your primary doctor.    If you experience loose stools or diarrhea, hold the medications until you stool forms back up.  If your symptoms do not get better within 1 week or if they get worse, check with your doctor.  If you experience the worst abdominal pain ever or develop nausea or vomiting, please contact the office immediately for further recommendations for treatment.   ITCHING:  If you experience itching with your medications, try taking only a single pain pill, or even half a pain pill at a time.  You can also use Benadryl  over the counter for itching or also to help with sleep.   TED HOSE STOCKINGS:  Use stockings on both legs until for at least 2 weeks or as directed by physician office. They may be removed at night for sleeping.  MEDICATIONS:  See your medication summary on the "After Visit Summary" that nursing will review with you.  You may have some home medications which will be placed on hold until you complete the course of blood thinner medication.  It is important for you to complete the blood thinner medication as  prescribed.  PRECAUTIONS:  If you experience chest pain or shortness of breath - call 911 immediately for transfer to the hospital emergency department.   If you develop a fever greater that 101 F, purulent drainage from wound, increased redness or drainage from wound, foul odor from the wound/dressing, or calf pain - CONTACT YOUR SURGEON.                                                   FOLLOW-UP APPOINTMENTS:  If you do not already have a post-op appointment, please call the office for an appointment to be seen by your surgeon.  Guidelines for how soon to be seen are listed in your "After Visit Summary", but are typically between 1-4 weeks after surgery.  OTHER INSTRUCTIONS:   Knee Replacement:  Do not place pillow under knee, focus on keeping the knee straight while resting. CPM instructions: 0-90 degrees, 2 hours in the morning, 2 hours in the afternoon, and 2 hours in the evening. Place foam block, curve side up under heel at all times except when in CPM or when walking.  DO NOT modify, tear, cut, or change the foam block in any way.  POST-OPERATIVE OPIOID TAPER INSTRUCTIONS: It is important to wean off of your opioid medication as soon as possible. If you do not need pain medication after your surgery it is ok to stop day one. Opioids include: Codeine, Hydrocodone(Norco, Vicodin), Oxycodone (Percocet, oxycontin ) and hydromorphone  amongst others.  Long term and even short term use of opiods can cause: Increased pain response Dependence Constipation Depression Respiratory depression And more.  Withdrawal symptoms can include Flu like symptoms Nausea, vomiting And more Techniques to manage these symptoms Hydrate well Eat regular healthy meals Stay active Use relaxation techniques(deep breathing, meditating, yoga) Do Not substitute Alcohol to help with tapering If you have been on opioids for less than two weeks and do not have pain than it is ok to stop all together.  Plan to  wean off of opioids This plan should start within one week post op of your joint replacement. Maintain the same interval or time between  taking each dose and first decrease the dose.  Cut the total daily intake of opioids by one tablet each day Next start to increase the time between doses. The last dose that should be eliminated is the evening dose.   MAKE SURE YOU:  Understand these instructions.  Get help right away if you are not doing well or get worse.    Thank you for letting us  be a part of your medical care team.  It is a privilege we respect greatly.  We hope these instructions will help you stay on track for a fast and full recovery!

## 2023-11-24 NOTE — Anesthesia Procedure Notes (Signed)
 Spinal  Start time: 11/24/2023 11:37 AM End time: 11/24/2023 11:39 AM Reason for block: surgical anesthesia Staffing Performed: anesthesiologist  Anesthesiologist: Tilford Franky BIRCH, MD Performed by: Tilford Franky BIRCH, MD Authorized by: Tilford Franky BIRCH, MD   Preanesthetic Checklist Completed: patient identified, IV checked, site marked, risks and benefits discussed, surgical consent, monitors and equipment checked, pre-op evaluation and timeout performed Spinal Block Patient position: sitting Prep: DuraPrep and site prepped and draped Location: L3-4 Injection technique: single-shot Needle Needle type: Pencan  Needle gauge: 24 G Needle length: 10 cm Needle insertion depth: 10 cm Additional Notes Patient tolerated well. No immediate complications.  Functioning IV was confirmed and monitors were applied. Sterile prep and drape, including hand hygiene and sterile gloves were used. The patient was positioned and the back was prepped. The skin was anesthetized with lidocaine . Free flow of clear CSF was obtained prior to injecting local anesthetic into the CSF. The spinal needle aspirated freely following injection. The needle was carefully withdrawn. The patient tolerated the procedure well.

## 2023-11-24 NOTE — Anesthesia Preprocedure Evaluation (Addendum)
 Anesthesia Evaluation  Patient identified by MRN, date of birth, ID band Patient awake    Reviewed: Allergy & Precautions, NPO status , Patient's Chart, lab work & pertinent test results  Airway Mallampati: I  TM Distance: >3 FB Neck ROM: Full    Dental  (+) Teeth Intact, Dental Advisory Given   Pulmonary former smoker   breath sounds clear to auscultation       Cardiovascular hypertension, Pt. on medications  Rhythm:Regular Rate:Normal     Neuro/Psych  Neuromuscular disease  negative psych ROS   GI/Hepatic Neg liver ROS,GERD  ,,  Endo/Other  negative endocrine ROS    Renal/GU negative Renal ROS     Musculoskeletal  (+) Arthritis ,    Abdominal   Peds  Hematology negative hematology ROS (+)   Anesthesia Other Findings   Reproductive/Obstetrics                              Anesthesia Physical Anesthesia Plan  ASA: 3  Anesthesia Plan: Spinal   Post-op Pain Management: Regional block*   Induction: Intravenous  PONV Risk Score and Plan: 3 and Ondansetron , Propofol  infusion and Treatment may vary due to age or medical condition  Airway Management Planned: Natural Airway and Nasal Cannula  Additional Equipment: None  Intra-op Plan:   Post-operative Plan:   Informed Consent: I have reviewed the patients History and Physical, chart, labs and discussed the procedure including the risks, benefits and alternatives for the proposed anesthesia with the patient or authorized representative who has indicated his/her understanding and acceptance.       Plan Discussed with: CRNA  Anesthesia Plan Comments: (Lab Results      Component                Value               Date                      WBC                      7.7                 11/16/2023                HGB                      12.0                11/16/2023                HCT                      36.4                 11/16/2023                MCV                      91.7                11/16/2023                PLT                      353  11/16/2023           )         Anesthesia Quick Evaluation

## 2023-11-24 NOTE — Interval H&P Note (Signed)
 History and Physical Interval Note:  11/24/2023 10:13 AM  Amy Schmidt  has presented today for surgery, with the diagnosis of Right knee osteoarthritis.  The various methods of treatment have been discussed with the patient and family. After consideration of risks, benefits and other options for treatment, the patient has consented to  Procedure(s): ARTHROPLASTY, KNEE, TOTAL (Right) as a surgical intervention.  The patient's history has been reviewed, patient examined, no change in status, stable for surgery.  I have reviewed the patient's chart and labs.  Questions were answered to the patient's satisfaction.     Donnice JONETTA Car

## 2023-11-24 NOTE — Evaluation (Signed)
 Physical Therapy Evaluation Patient Details Name: Amy Schmidt MRN: [id number] DOB: 1945-09-03 Today's Date: 11/24/2023  History of Present Illness  78 yo female presents to therapy s/p R TKA on 11/24/2023 due to failure of conservative measures. Pt PMH includes but is not limited to: PD, arthritis, GERD, HLD, HTN, and L THA, AA (2017).  Clinical Impression    Amy Schmidt is a 78 y.o. female POD 0 s/p  R TKA. Patient reports IND with mobility at baseline. Patient is now limited by functional impairments (see PT problem list below) and requires S for bed mobility and CGA for transfers. Patient was unable to safely ambulate at time of eval due to R knee instability and abn sensation.Patient instructed in exercise to facilitate ROM and circulation to manage edema. Patient will benefit from continued skilled PT interventions to address impairments and progress towards PLOF. Acute PT will follow to progress mobility and stair training in preparation for safe discharge home with family support and OPPT services.       If plan is discharge home, recommend the following: A little help with walking and/or transfers;A little help with bathing/dressing/bathroom;Assistance with cooking/housework;Assist for transportation;Help with stairs or ramp for entrance   Can travel by private vehicle        Equipment Recommendations None recommended by PT  Recommendations for Other Services       Functional Status Assessment Patient has had a recent decline in their functional status and demonstrates the ability to make significant improvements in function in a reasonable and predictable amount of time.     Precautions / Restrictions Precautions Precautions: Knee;Fall Restrictions Weight Bearing Restrictions Per Provider Order: No      Mobility  Bed Mobility Overal bed mobility: Needs Assistance Bed Mobility: Supine to Sit     Supine to sit: Supervision     General bed mobility comments: min  cues HOB elevated    Transfers Overall transfer level: Needs assistance Equipment used: Rolling walker (2 wheels) Transfers: Sit to/from Stand, Bed to chair/wheelchair/BSC Sit to Stand: Contact guard assist Stand pivot transfers: Contact guard assist         General transfer comment: pt required min cues and B UE support at RW due to R knee instability and R foot abn sensation pt unable to progress with gait tasks at time of eval    Ambulation/Gait               General Gait Details: NT due to R knee instabilty and abn sensation  Stairs            Wheelchair Mobility     Tilt Bed    Modified Rankin (Stroke Patients Only)       Balance Overall balance assessment: Needs assistance Sitting-balance support: Feet supported Sitting balance-Leahy Scale: Good     Standing balance support: Bilateral upper extremity supported, During functional activity, Reliant on assistive device for balance Standing balance-Leahy Scale: Poor                               Pertinent Vitals/Pain Pain Assessment Pain Assessment: No/denies pain    Home Living Family/patient expects to be discharged to:: Private residence Living Arrangements: Spouse/significant other Available Help at Discharge: Family Type of Home: House Home Access: Stairs to enter Entrance Stairs-Rails: None Entrance Stairs-Number of Steps: 1   Home Layout: Two level;Laundry or work area in basement;Able to live on main level with bedroom/bathroom Home  Equipment: Agricultural consultant (2 wheels);Cane - single point      Prior Function Prior Level of Function : Independent/Modified Independent;Driving             Mobility Comments: IND no AD for all ADLs, self care tasks and IADLs       Extremity/Trunk Assessment        Lower Extremity Assessment Lower Extremity Assessment: RLE deficits/detail RLE Deficits / Details: ankle DF/PF 4/5; SLR > 10 degree lag RLE Sensation: decreased  proprioception;decreased light touch    Cervical / Trunk Assessment Cervical / Trunk Assessment: Normal  Communication   Communication Communication: No apparent difficulties    Cognition Arousal: Alert Behavior During Therapy: WFL for tasks assessed/performed   PT - Cognitive impairments: No apparent impairments                         Following commands: Intact       Cueing       General Comments General comments (skin integrity, edema, etc.): pt is concerned about DVT and slow regression of anesthesia    Exercises Total Joint Exercises Ankle Circles/Pumps: AROM, Both, 10 reps   Assessment/Plan    PT Assessment Patient needs continued PT services  PT Problem List Decreased strength;Decreased range of motion;Decreased activity tolerance;Decreased balance;Decreased mobility;Decreased coordination;Pain       PT Treatment Interventions DME instruction;Gait training;Stair training;Functional mobility training;Therapeutic activities;Therapeutic exercise;Balance training;Neuromuscular re-education;Patient/family education;Modalities    PT Goals (Current goals can be found in the Care Plan section)  Acute Rehab PT Goals Patient Stated Goal: to be able to go home Wednesday PT Goal Formulation: With patient Time For Goal Achievement: 12/08/23 Potential to Achieve Goals: Good    Frequency 7X/week     Co-evaluation               AM-PAC PT 6 Clicks Mobility  Outcome Measure Help needed turning from your back to your side while in a flat bed without using bedrails?: None Help needed moving from lying on your back to sitting on the side of a flat bed without using bedrails?: A Little Help needed moving to and from a bed to a chair (including a wheelchair)?: A Little Help needed standing up from a chair using your arms (e.g., wheelchair or bedside chair)?: A Little Help needed to walk in hospital room?: A Lot Help needed climbing 3-5 steps with a railing?  : Total 6 Click Score: 16    End of Session Equipment Utilized During Treatment: Gait belt Activity Tolerance: Patient tolerated treatment well Patient left: in chair;with call bell/phone within reach Nurse Communication: Mobility status PT Visit Diagnosis: Unsteadiness on feet (R26.81);Other abnormalities of gait and mobility (R26.89);Muscle weakness (generalized) (M62.81);Pain;Difficulty in walking, not elsewhere classified (R26.2) Pain - Right/Left:  (pt continues to report no R knee pain)    Time: 8181-8162 PT Time Calculation (min) (ACUTE ONLY): 19 min   Charges:   PT Evaluation $PT Eval Low Complexity: 1 Low   PT General Charges $$ ACUTE PT VISIT: 1 Visit         [place], PT Acute Rehab   [place] VEAR Drone 11/24/2023, 7:20 PM

## 2023-11-24 NOTE — Anesthesia Procedure Notes (Signed)
 Anesthesia Regional Block: Adductor canal block   Pre-Anesthetic Checklist: , timeout performed,  Correct Patient, Correct Site, Correct Laterality,  Correct Procedure, Correct Position, site marked,  Risks and benefits discussed,  Surgical consent,  Pre-op evaluation,  At surgeon's request and post-op pain management  Laterality: Right  Prep: chloraprep       Needles:  Injection technique: Single-shot  Needle Type: Echogenic Stimulator Needle     Needle Length: 9cm  Needle Gauge: 21     Additional Needles:   Procedures:,,,, ultrasound used (permanent image in chart),,    Narrative:  Start time: 11/24/2023 10:30 AM End time: 11/24/2023 10:35 AM Injection made incrementally with aspirations every 5 mL.  Performed by: Personally  Anesthesiologist: Tilford Franky BIRCH, MD  Additional Notes: Discussed risks and benefits of the nerve block in detail, including but not limited vascular injury, permanent nerve damage and infection.   Patient tolerated the procedure well. Local anesthetic introduced in an incremental fashion under minimal resistance after negative aspirations. No paresthesias were elicited. After completion of the procedure, no acute issues were identified and patient continued to be monitored by RN.

## 2023-11-25 ENCOUNTER — Encounter (HOSPITAL_COMMUNITY): Payer: Self-pay | Admitting: Orthopedic Surgery

## 2023-11-25 DIAGNOSIS — M1711 Unilateral primary osteoarthritis, right knee: Secondary | ICD-10-CM | POA: Diagnosis not present

## 2023-11-25 LAB — BASIC METABOLIC PANEL WITH GFR
Anion gap: 11 (ref 5–15)
BUN: 25 mg/dL — ABNORMAL HIGH (ref 8–23)
CO2: 22 mmol/L (ref 22–32)
Calcium: 8.7 mg/dL — ABNORMAL LOW (ref 8.9–10.3)
Chloride: 101 mmol/L (ref 98–111)
Creatinine, Ser: 0.92 mg/dL (ref 0.44–1.00)
GFR, Estimated: >60 mL/min (ref >60)
Glucose, Bld: 127 mg/dL — ABNORMAL HIGH (ref 70–99)
Potassium: 4 mmol/L (ref 3.5–5.1)
Sodium: 134 mmol/L — ABNORMAL LOW (ref 135–145)

## 2023-11-25 LAB — CBC
HCT: 27.6 % — ABNORMAL LOW (ref 36.0–46.0)
Hemoglobin: 9.3 g/dL — ABNORMAL LOW (ref 12.0–15.0)
MCH: 30.9 pg (ref 26.0–34.0)
MCHC: 33.7 g/dL (ref 30.0–36.0)
MCV: 91.7 fL (ref 80.0–100.0)
Platelets: 284 K/uL (ref 150–400)
RBC: 3.01 MIL/uL — ABNORMAL LOW (ref 3.87–5.11)
RDW: 12.1 % (ref 11.5–15.5)
WBC: 11.8 K/uL — ABNORMAL HIGH (ref 4.0–10.5)
nRBC: 0 % (ref 0.0–0.2)

## 2023-11-25 MED ORDER — METHOCARBAMOL 500 MG PO TABS: 500.0000 mg | ORAL_TABLET | Freq: Four times a day (QID) | ORAL | 2 refills | Status: AC | PRN

## 2023-11-25 MED ORDER — POLYETHYLENE GLYCOL 3350 17 G PO PACK: 17.0000 g | PACK | Freq: Two times a day (BID) | ORAL | Status: AC

## 2023-11-25 MED ORDER — OXYCODONE HCL 5 MG PO TABS: 5.0000 mg | ORAL_TABLET | ORAL | Status: AC | PRN

## 2023-11-25 MED ORDER — ASPIRIN 81 MG PO CHEW: 81.0000 mg | CHEWABLE_TABLET | Freq: Two times a day (BID) | ORAL | 0 refills | Status: AC

## 2023-11-25 MED ORDER — CELECOXIB 200 MG PO CAPS: 200.0000 mg | ORAL_CAPSULE | Freq: Two times a day (BID) | ORAL | 0 refills | Status: AC

## 2023-11-25 MED ORDER — SENNA 8.6 MG PO TABS: 2.0000 | ORAL_TABLET | Freq: Every day | ORAL | 0 refills | Status: AC

## 2023-11-25 NOTE — Care Management Obs Status (Signed)
 MEDICARE OBSERVATION STATUS NOTIFICATION   Patient Details  Name: Amy Schmidt MRN: [id number] Date of Birth: 1945-09-14   Medicare Observation Status Notification Given:  Yes    Alfonse JONELLE Rex, RN 11/25/2023, 9:32 AM

## 2023-11-25 NOTE — Progress Notes (Addendum)
 Patient ID: Amy Schmidt, female   DOB: [birth year], 78 y.o.   MRN: [id number] Subjective: 1 Day Post-Op Procedure(s) (LRB): ARTHROPLASTY, KNEE, TOTAL (Right)    Patient reports pain as mild to moderate.  No reported events overnight.  She does report some tightness and pain of anterior lateral knee.  We discussed this as being normal.  Objective:   VITALS:   Vitals:   11/25/23 0123 11/25/23 0651  BP: 113/73 125/66  Pulse: 75 66  Resp: 18 18  Temp: 97.8 F (36.6 C) 97.7 F (36.5 C)  SpO2: 94% 97%    Neurovascular intact Incision: dressing C/D/I  LABS Recent Labs    11/25/23 0353  HGB 9.3*  HCT 27.6*  WBC 11.8*  PLT 284    Recent Labs    11/25/23 0353  NA 134*  K 4.0  BUN 25*  CREATININE 0.92  GLUCOSE 127*    No results for input(s): LABPT, INR in the last 72 hours.   Assessment/Plan: 1 Day Post-Op Procedure(s) (LRB): ARTHROPLASTY, KNEE, TOTAL (Right)   Advance diet Up with therapy Today we reviewed the postop course and expectations.  We reviewed the importance of working on knee range of motion every hour working on extension and flexion exercises.  She does have outpatient physical therapy already set up to begin on Friday.  We reviewed medication use and concerns for constipation.  We discussed sleeping and sleep aids in the form of melatonin which she has used in the past. Will see her back in 2 weeks to assess her initial postoperative recovery. We reviewed dressing care including her Aquacel dressing.  She will likely be discharged today after physical therapy.

## 2023-11-25 NOTE — TOC Transition Note (Signed)
 Transition of Care [facility]) - Discharge Note   Patient Details  Name: Graciela Plato MRN: [id number] Date of Birth: 11/21/1945  Transition of Care [facility]) CM/SW Contact:  Alfonse JONELLE Rex, RN Phone Number: 11/25/2023, 10:30 AM   Clinical Narrative:   Met with patient to review dc therapy and home equipment needs, pt confirmed OPPT-DOAR, has a RW. MOON completed. No TOC needs    Final next level of care: OP Rehab Barriers to Discharge: No Barriers Identified   Patient Goals and CMS Choice Patient states their goals for this hospitalization and ongoing recovery are:: return home          Discharge Placement                       Discharge Plan and Services Additional resources added to the After Visit Summary for                                       Social Drivers of Health (SDOH) Interventions SDOH Screenings   Food Insecurity: No Food Insecurity (11/24/2023)  Housing: Low Risk  (11/24/2023)  Transportation Needs: No Transportation Needs (11/24/2023)  Utilities: Not At Risk (11/24/2023)  Social Connections: Moderately Isolated (11/24/2023)  Tobacco Use: Medium Risk (11/24/2023)     Readmission Risk Interventions     No data to display

## 2023-11-25 NOTE — Progress Notes (Signed)
 Patient transferred from the chair to the bed at hs. No issue with knee buckle.

## 2023-11-25 NOTE — Progress Notes (Addendum)
 Physical Therapy Treatment Patient Details Name: Amy Schmidt MRN: [id number] DOB: Mar 01, 1946 Today's Date: 11/25/2023   History of Present Illness 78 yo female presents to therapy s/p R TKA on 11/24/2023 due to failure of conservative measures. Pt PMH includes but is not limited to: PD, arthritis, GERD, HLD, HTN, and L THA, AA (2017).    PT Comments   Amy Schmidt is a 78 y.o. female POD 1 s/p R TKA. Patient reports IND with mobility at baseline. Patient is now limited by functional impairments (see PT problem list below) and is mod I for bed mobility and S for transfers. Patient was able to ambulate 150 feet x 2 with RW and CGA to S level of assist. Patient instructed in exercise to facilitate ROM and circulation to manage edema reviewed and HO provided.  Patient will benefit from continued skilled PT interventions to address impairments and progress towards PLOF. Acute PT will follow to progress mobility and stair training in preparation for safe discharge home with family support and OPPT services scheduled for Friday 9/26. Pt R knee AROM in supine grossly 0-85 degrees. Pt has adequately met all functional mobility goals and is appropriate to d/c home at this time, pending MD recommendation.     If plan is discharge home, recommend the following: A little help with walking and/or transfers;A little help with bathing/dressing/bathroom;Assistance with cooking/housework;Assist for transportation;Help with stairs or ramp for entrance   Can travel by private vehicle        Equipment Recommendations  None recommended by PT    Recommendations for Other Services       Precautions / Restrictions Precautions Precautions: Knee;Fall Restrictions Weight Bearing Restrictions Per Provider Order: Yes     Mobility  Bed Mobility Overal bed mobility: Modified Independent Bed Mobility: Supine to Sit, Sit to Supine                Transfers Overall transfer level: Needs  assistance Equipment used: Rolling walker (2 wheels) Transfers: Sit to/from Stand, Bed to chair/wheelchair/BSC Sit to Stand: Supervision Stand pivot transfers: Supervision         General transfer comment: min cues for hand placement    Ambulation/Gait Ambulation/Gait assistance: Supervision, Contact guard assist Gait Distance (Feet): 150 Feet Assistive device: Rolling walker (2 wheels) Gait Pattern/deviations: Step-to pattern, Decreased stance time - right, Antalgic, Trunk flexed Gait velocity: decreased     General Gait Details: Slight trunk flexion with B UE support at RW, min cues for safety and sequencing with pt able to ambulate with CGA and progress to S with min cues   Stairs Stairs: Yes Stairs assistance: Contact guard assist, Supervision Stair Management: With walker Number of Stairs: 1 General stair comments: one 7 inch step x 2 with use of RW and min cues for technique.   Wheelchair Mobility     Tilt Bed    Modified Rankin (Stroke Patients Only)       Balance Overall balance assessment: Needs assistance Sitting-balance support: Feet supported Sitting balance-Leahy Scale: Good     Standing balance support: Bilateral upper extremity supported, During functional activity, Reliant on assistive device for balance Standing balance-Leahy Scale: Fair Standing balance comment: static standing no UE support                            Communication Communication Communication: No apparent difficulties  Cognition Arousal: Alert Behavior During Therapy: WFL for tasks assessed/performed   PT - Cognitive impairments:  No apparent impairments                         Following commands: Intact      Cueing    Exercises Total Joint Exercises Ankle Circles/Pumps: AROM, Both, 10 reps Quad Sets: AROM, Right, 10 reps Short Arc Quad: AROM, Right, 10 reps Heel Slides: AROM, Right, 10 reps Hip ABduction/ADduction: AROM, Right, 10  reps Straight Leg Raises: AROM, Right, 10 reps Long Arc Quad: AROM, Right, 10 reps, Seated Knee Flexion: AROM, Right, 10 reps, Seated    General Comments        Pertinent Vitals/Pain Pain Assessment Pain Assessment: 0-10 Pain Score: 4  Pain Location: R knee and LE Pain Descriptors / Indicators: Sore, Aching, Discomfort (stiff) Pain Intervention(s): Limited activity within patient's tolerance, Monitored during session, Premedicated before session, Repositioned, Ice applied    Home Living                          Prior Function            PT Goals (current goals can now be found in the care plan section) Acute Rehab PT Goals Patient Stated Goal: to be able to go home Wednesday PT Goal Formulation: With patient Time For Goal Achievement: 12/08/23 Potential to Achieve Goals: Good Progress towards PT goals: Progressing toward goals    Frequency    7X/week      PT Plan      Co-evaluation              AM-PAC PT 6 Clicks Mobility   Outcome Measure  Help needed turning from your back to your side while in a flat bed without using bedrails?: None Help needed moving from lying on your back to sitting on the side of a flat bed without using bedrails?: None Help needed moving to and from a bed to a chair (including a wheelchair)?: A Little Help needed standing up from a chair using your arms (e.g., wheelchair or bedside chair)?: A Little Help needed to walk in hospital room?: A Little Help needed climbing 3-5 steps with a railing? : A Little 6 Click Score: 20    End of Session Equipment Utilized During Treatment: Gait belt Activity Tolerance: Patient tolerated treatment well Patient left: in chair;with call bell/phone within reach Nurse Communication: Mobility status PT Visit Diagnosis: Unsteadiness on feet (R26.81);Other abnormalities of gait and mobility (R26.89);Muscle weakness (generalized) (M62.81);Pain;Difficulty in walking, not elsewhere  classified (R26.2) Pain - Right/Left: Right Pain - part of body: Knee;Leg     Time: 8992-8968 PT Time Calculation (min) (ACUTE ONLY): 24 min  Charges:    $Gait Training: 8-22 mins $Therapeutic Exercise: 8-22 mins PT General Charges $$ ACUTE PT VISIT: 1 Visit                     [place], PT Acute Rehab    [place] VEAR Drone 11/25/2023, 12:56 PM

## 2023-12-03 NOTE — Discharge Summary (Signed)
 Patient ID: Amy Schmidt MRN: [id number] DOB/AGE: [birth year] 78 y.o.  Admit date: 11/24/2023 Discharge date: 11/25/2023  Admission Diagnoses:  Right knee osteoarthritis  Discharge Diagnoses:  Principal Problem:   S/P total knee arthroplasty, right   Past Medical History:  Diagnosis Date   Arthritis    Complication of anesthesia    SLOW TO WAKE UP   GERD (gastroesophageal reflux disease)    Hyperlipidemia    Hypertension    Neuromuscular disorder (HCC)    Osteopenia    Recurrent UTI     Surgeries: Procedure(s): ARTHROPLASTY, KNEE, TOTAL on 11/24/2023   Consultants:   Discharged Condition: Improved  Hospital Course: Amy Schmidt is an 78 y.o. female who was admitted 11/24/2023 for operative treatment ofS/P total knee arthroplasty, right. Patient has severe unremitting pain that affects sleep, daily activities, and work/hobbies. After pre-op clearance the patient was taken to the operating room on 11/24/2023 and underwent  Procedure(s): ARTHROPLASTY, KNEE, TOTAL.    Patient was given perioperative antibiotics:  Anti-infectives (From admission, onward)    Start     Dose/Rate Route Frequency Ordered Stop   11/24/23 1700  ceFAZolin  (ANCEF ) IVPB 2g/100 mL premix        2 g 200 mL/hr over 30 Minutes Intravenous Every 6 hours 11/24/23 1514 11/24/23 2250   11/24/23 0845  ceFAZolin  (ANCEF ) IVPB 2g/100 mL premix        2 g 200 mL/hr over 30 Minutes Intravenous On call to O.R. 11/24/23 0841 11/24/23 1144        Patient was given sequential compression devices, early ambulation, and chemoprophylaxis to prevent DVT. Patient worked with PT and was meeting their goals regarding safe ambulation and transfers.  Patient benefited maximally from hospital stay and there were no complications.    Recent vital signs: No data found.   Recent laboratory studies: No results for input(s): WBC, HGB, HCT, PLT, NA, K, CL, CO2, BUN, CREATININE, GLUCOSE,  INR, CALCIUM  in the last 72 hours.  Invalid input(s): PT, 2   Discharge Medications:   Allergies as of 11/25/2023   No Known Allergies      Medication List     STOP taking these medications    diclofenac 50 MG tablet Commonly known as: CATAFLAM       TAKE these medications    acetaminophen  650 MG CR tablet Commonly known as: TYLENOL  Take 1,300 mg by mouth every 8 (eight) hours as needed for pain.   amLODipine  5 MG tablet Commonly known as: NORVASC  Take 5 mg by mouth at bedtime.   ascorbic acid 500 MG tablet Commonly known as: VITAMIN C Take 500 mg by mouth every morning.   aspirin  81 MG chewable tablet Chew 1 tablet (81 mg total) by mouth 2 (two) times daily for 28 days.   atorvastatin  10 MG tablet Commonly known as: LIPITOR Take 5 mg by mouth at bedtime.   b complex vitamins tablet Take 1 tablet by mouth every morning.   CALCIUM  + VITAMIN D3 PO Take 600 mg by mouth daily.   carbidopa -levodopa  25-100 MG tablet Commonly known as: SINEMET  IR TAKE 1 TABLET BY MOUTH 3 (THREE) TIMES DAILY. 6AM/10AM/3PM   celecoxib  200 MG capsule Commonly known as: CeleBREX  Take 1 capsule (200 mg total) by mouth 2 (two) times daily.   cholecalciferol 1000 units tablet Commonly known as: VITAMIN D Take 1,000 Units by mouth every morning.   CO Q 10 PO Take 200 mg by mouth daily.   fluocinonide  ointment 0.05 %  Commonly known as: LIDEX  Apply 1 application  topically daily as needed (sore gums).   lansoprazole 30 MG capsule Commonly known as: PREVACID Take 30 mg by mouth daily.   lisinopril -hydrochlorothiazide  10-12.5 MG tablet Commonly known as: ZESTORETIC  Take 1 tablet by mouth every morning.   methocarbamol  500 MG tablet Commonly known as: ROBAXIN  Take 1 tablet (500 mg total) by mouth every 6 (six) hours as needed for muscle spasms.   oxyCODONE  5 MG immediate release tablet Commonly known as: Oxy IR/ROXICODONE  Take 1 tablet (5 mg total) by mouth every 4  (four) hours as needed for severe pain (pain score 7-10).   phenazopyridine 95 MG tablet Commonly known as: PYRIDIUM Take 95 mg by mouth daily. AZO   polyethylene glycol 17 g packet Commonly known as: MIRALAX  / GLYCOLAX  Take 17 g by mouth 2 (two) times daily.   PRESERVISION AREDS 2 PO Take 1 capsule by mouth 2 (two) times daily.   PROBIOTIC PO Take 1 tablet by mouth daily with lunch.   rOPINIRole  0.5 MG tablet Commonly known as: REQUIP  Take 0.5 mg by mouth at bedtime.   senna 8.6 MG Tabs tablet Commonly known as: SENOKOT Take 2 tablets (17.2 mg total) by mouth at bedtime for 14 days.               Discharge Care Instructions  (From admission, onward)           Start     Ordered   11/25/23 0000  Change dressing       Comments: Maintain surgical dressing until follow up in the clinic. If the edges start to pull up, may reinforce with tape. If the dressing is no longer working, may remove and cover with gauze and tape, but must keep the area dry and clean.  Call with any questions or concerns.   11/25/23 1237            Diagnostic Studies: No results found.  Disposition: Discharge disposition: 01-Home or Self Care       Discharge Instructions     Call MD / Call 911   Complete by: As directed    If you experience chest pain or shortness of breath, CALL 911 and be transported to the hospital emergency room.  If you develope a fever above 101 F, pus (white drainage) or increased drainage or redness at the wound, or calf pain, call your surgeon's office.   Change dressing   Complete by: As directed    Maintain surgical dressing until follow up in the clinic. If the edges start to pull up, may reinforce with tape. If the dressing is no longer working, may remove and cover with gauze and tape, but must keep the area dry and clean.  Call with any questions or concerns.   Constipation Prevention   Complete by: As directed    Drink plenty of fluids.  Prune  juice may be helpful.  You may use a stool softener, such as Colace (over the counter) 100 mg twice a day.  Use MiraLax  (over the counter) for constipation as needed.   Diet - low sodium heart healthy   Complete by: As directed    Increase activity slowly as tolerated   Complete by: As directed    Weight bearing as tolerated with assist device (walker, cane, etc) as directed, use it as long as suggested by your surgeon or therapist, typically at least 4-6 weeks.   Post-operative opioid taper instructions:   Complete by:  As directed    POST-OPERATIVE OPIOID TAPER INSTRUCTIONS: It is important to wean off of your opioid medication as soon as possible. If you do not need pain medication after your surgery it is ok to stop day one. Opioids include: Codeine, Hydrocodone (Norco, Vicodin), Oxycodone (Percocet, oxycontin ) and hydromorphone  amongst others.  Long term and even short term use of opiods can cause: Increased pain response Dependence Constipation Depression Respiratory depression And more.  Withdrawal symptoms can include Flu like symptoms Nausea, vomiting And more Techniques to manage these symptoms Hydrate well Eat regular healthy meals Stay active Use relaxation techniques(deep breathing, meditating, yoga) Do Not substitute Alcohol to help with tapering If you have been on opioids for less than two weeks and do not have pain than it is ok to stop all together.  Plan to wean off of opioids This plan should start within one week post op of your joint replacement. Maintain the same interval or time between taking each dose and first decrease the dose.  Cut the total daily intake of opioids by one tablet each day Next start to increase the time between doses. The last dose that should be eliminated is the evening dose.      TED hose   Complete by: As directed    Use stockings (TED hose) for 2 weeks on both leg(s).  You may remove them at night for sleeping.         Follow-up Information     Ernie Cough, MD. Schedule an appointment as soon as possible for a visit in 2 week(s).   Specialty: Orthopedic Surgery Contact information: [address] [place] [address] [phone]                  Signed: Rosina JONELLE Schmidt 12/03/2023, 10:54 AM

## 2024-01-04 ENCOUNTER — Other Ambulatory Visit: Payer: Self-pay | Admitting: Neurology

## 2024-01-04 DIAGNOSIS — G20A1 Parkinson's disease without dyskinesia, without mention of fluctuations: Secondary | ICD-10-CM

## 2024-04-02 ENCOUNTER — Other Ambulatory Visit: Payer: Self-pay | Admitting: Neurology

## 2024-04-02 DIAGNOSIS — G20A1 Parkinson's disease without dyskinesia, without mention of fluctuations: Secondary | ICD-10-CM

## 2024-05-26 ENCOUNTER — Ambulatory Visit: Admitting: Neurology

## 2024-06-14 ENCOUNTER — Ambulatory Visit (HOSPITAL_COMMUNITY): Admit: 2024-06-14 | Admitting: Orthopedic Surgery

## 2024-06-14 SURGERY — ARTHROPLASTY, KNEE, TOTAL
Anesthesia: Spinal | Site: Knee | Laterality: Left
